# Patient Record
Sex: Female | Born: 1935 | Race: White | Hispanic: No | Smoking: Never smoker
Health system: Southern US, Community
[De-identification: ages and names within clinical notes are randomized; demographics above are authoritative.]

## PROBLEM LIST (undated history)

## (undated) DIAGNOSIS — I951 Orthostatic hypotension: Secondary | ICD-10-CM

## (undated) DIAGNOSIS — E039 Hypothyroidism, unspecified: Secondary | ICD-10-CM

## (undated) DIAGNOSIS — K7460 Unspecified cirrhosis of liver: Secondary | ICD-10-CM

## (undated) DIAGNOSIS — N390 Urinary tract infection, site not specified: Secondary | ICD-10-CM

## (undated) DIAGNOSIS — I10 Essential (primary) hypertension: Secondary | ICD-10-CM

## (undated) DIAGNOSIS — D649 Anemia, unspecified: Secondary | ICD-10-CM

## (undated) DIAGNOSIS — I471 Supraventricular tachycardia: Secondary | ICD-10-CM

## (undated) DIAGNOSIS — A310 Pulmonary mycobacterial infection: Secondary | ICD-10-CM

## (undated) DIAGNOSIS — M1990 Unspecified osteoarthritis, unspecified site: Secondary | ICD-10-CM

## (undated) DIAGNOSIS — I447 Left bundle-branch block, unspecified: Secondary | ICD-10-CM

## (undated) DIAGNOSIS — I4719 Other supraventricular tachycardia: Secondary | ICD-10-CM

## (undated) DIAGNOSIS — R42 Dizziness and giddiness: Secondary | ICD-10-CM

## (undated) DIAGNOSIS — G43909 Migraine, unspecified, not intractable, without status migrainosus: Secondary | ICD-10-CM

## (undated) DIAGNOSIS — Z853 Personal history of malignant neoplasm of breast: Secondary | ICD-10-CM

## (undated) DIAGNOSIS — F419 Anxiety disorder, unspecified: Secondary | ICD-10-CM

## (undated) DIAGNOSIS — Z8619 Personal history of other infectious and parasitic diseases: Secondary | ICD-10-CM

## (undated) DIAGNOSIS — E871 Hypo-osmolality and hyponatremia: Secondary | ICD-10-CM

## (undated) HISTORY — DX: Orthostatic hypotension: I95.1

## (undated) HISTORY — DX: Essential (primary) hypertension: I10

## (undated) HISTORY — PX: OTHER SURGICAL HISTORY: SHX169

## (undated) HISTORY — DX: Personal history of other infectious and parasitic diseases: Z86.19

## (undated) HISTORY — DX: Unspecified cirrhosis of liver: K74.60

## (undated) HISTORY — DX: Personal history of malignant neoplasm of breast: Z85.3

## (undated) HISTORY — DX: Urinary tract infection, site not specified: N39.0

## (undated) HISTORY — DX: Hypothyroidism, unspecified: E03.9

## (undated) HISTORY — PX: BUNIONECTOMY: SHX129

## (undated) HISTORY — PX: HEMORRHOID SURGERY: SHX153

## (undated) HISTORY — PX: CHOLECYSTECTOMY: SHX55

## (undated) HISTORY — PX: TUBAL LIGATION: SHX77

## (undated) HISTORY — DX: Pulmonary mycobacterial infection: A31.0

## (undated) HISTORY — DX: Hypo-osmolality and hyponatremia: E87.1

## (undated) HISTORY — PX: TONSILLECTOMY: SUR1361

## (undated) HISTORY — DX: Anxiety disorder, unspecified: F41.9

## (undated) HISTORY — DX: Anemia, unspecified: D64.9

## (undated) HISTORY — DX: Supraventricular tachycardia: I47.1

## (undated) HISTORY — DX: Dizziness and giddiness: R42

## (undated) HISTORY — DX: Left bundle-branch block, unspecified: I44.7

## (undated) HISTORY — DX: Other supraventricular tachycardia: I47.19

## (undated) HISTORY — PX: BREAST LUMPECTOMY: SHX2

## (undated) HISTORY — DX: Unspecified osteoarthritis, unspecified site: M19.90

## (undated) HISTORY — PX: CARPAL TUNNEL RELEASE: SHX101

## (undated) HISTORY — DX: Migraine, unspecified, not intractable, without status migrainosus: G43.909

---

## 2007-12-14 ENCOUNTER — Encounter: Payer: Self-pay | Admitting: Internal Medicine

## 2008-04-21 ENCOUNTER — Ambulatory Visit: Payer: Self-pay | Admitting: Cardiology

## 2008-04-21 ENCOUNTER — Ambulatory Visit: Payer: Self-pay | Admitting: Internal Medicine

## 2008-04-21 ENCOUNTER — Inpatient Hospital Stay (HOSPITAL_COMMUNITY): Admission: EM | Admit: 2008-04-21 | Discharge: 2008-04-25 | Payer: Self-pay | Admitting: Emergency Medicine

## 2008-04-22 ENCOUNTER — Ambulatory Visit: Payer: Self-pay | Admitting: Surgery

## 2008-04-22 ENCOUNTER — Encounter: Payer: Self-pay | Admitting: Internal Medicine

## 2008-04-22 ENCOUNTER — Encounter (INDEPENDENT_AMBULATORY_CARE_PROVIDER_SITE_OTHER): Payer: Self-pay | Admitting: Internal Medicine

## 2008-04-24 ENCOUNTER — Ambulatory Visit: Payer: Self-pay | Admitting: Infectious Diseases

## 2008-04-25 ENCOUNTER — Encounter: Payer: Self-pay | Admitting: Internal Medicine

## 2008-05-02 ENCOUNTER — Ambulatory Visit: Payer: Self-pay | Admitting: Internal Medicine

## 2008-05-20 ENCOUNTER — Encounter: Payer: Self-pay | Admitting: Internal Medicine

## 2008-06-09 ENCOUNTER — Ambulatory Visit: Payer: Self-pay | Admitting: Internal Medicine

## 2008-06-28 ENCOUNTER — Ambulatory Visit: Payer: Self-pay | Admitting: Internal Medicine

## 2008-06-28 DIAGNOSIS — N3090 Cystitis, unspecified without hematuria: Secondary | ICD-10-CM | POA: Insufficient documentation

## 2008-06-28 DIAGNOSIS — A310 Pulmonary mycobacterial infection: Secondary | ICD-10-CM

## 2008-07-01 DIAGNOSIS — I498 Other specified cardiac arrhythmias: Secondary | ICD-10-CM

## 2008-07-01 DIAGNOSIS — Z853 Personal history of malignant neoplasm of breast: Secondary | ICD-10-CM

## 2008-07-01 DIAGNOSIS — D649 Anemia, unspecified: Secondary | ICD-10-CM

## 2008-07-01 DIAGNOSIS — J984 Other disorders of lung: Secondary | ICD-10-CM | POA: Insufficient documentation

## 2008-07-01 DIAGNOSIS — Z8669 Personal history of other diseases of the nervous system and sense organs: Secondary | ICD-10-CM | POA: Insufficient documentation

## 2008-07-01 DIAGNOSIS — M810 Age-related osteoporosis without current pathological fracture: Secondary | ICD-10-CM | POA: Insufficient documentation

## 2008-07-01 DIAGNOSIS — I10 Essential (primary) hypertension: Secondary | ICD-10-CM | POA: Insufficient documentation

## 2008-07-01 DIAGNOSIS — E039 Hypothyroidism, unspecified: Secondary | ICD-10-CM | POA: Insufficient documentation

## 2008-07-01 DIAGNOSIS — K279 Peptic ulcer, site unspecified, unspecified as acute or chronic, without hemorrhage or perforation: Secondary | ICD-10-CM | POA: Insufficient documentation

## 2008-07-01 DIAGNOSIS — J309 Allergic rhinitis, unspecified: Secondary | ICD-10-CM | POA: Insufficient documentation

## 2008-07-01 DIAGNOSIS — Z9189 Other specified personal risk factors, not elsewhere classified: Secondary | ICD-10-CM | POA: Insufficient documentation

## 2008-07-06 ENCOUNTER — Encounter: Payer: Self-pay | Admitting: Internal Medicine

## 2008-07-13 ENCOUNTER — Encounter: Payer: Self-pay | Admitting: Internal Medicine

## 2008-07-22 ENCOUNTER — Ambulatory Visit: Payer: Self-pay | Admitting: Oncology

## 2008-08-17 ENCOUNTER — Ambulatory Visit: Payer: Self-pay | Admitting: Internal Medicine

## 2008-08-17 LAB — CONVERTED CEMR LAB
AST: 59 units/L — ABNORMAL HIGH (ref 0–37)
Albumin: 3.6 g/dL (ref 3.5–5.2)
Calcium: 9.4 mg/dL (ref 8.4–10.5)
Glucose, Bld: 66 mg/dL — ABNORMAL LOW (ref 70–99)
Potassium: 4.5 meq/L (ref 3.5–5.3)
Total Bilirubin: 0.9 mg/dL (ref 0.3–1.2)

## 2008-08-18 ENCOUNTER — Telehealth: Payer: Self-pay | Admitting: Internal Medicine

## 2008-08-24 ENCOUNTER — Ambulatory Visit (HOSPITAL_COMMUNITY): Admission: RE | Admit: 2008-08-24 | Discharge: 2008-08-24 | Payer: Self-pay | Admitting: Internal Medicine

## 2008-09-16 ENCOUNTER — Ambulatory Visit: Payer: Self-pay | Admitting: Oncology

## 2008-09-26 ENCOUNTER — Ambulatory Visit: Payer: Self-pay | Admitting: Internal Medicine

## 2008-09-26 DIAGNOSIS — K759 Inflammatory liver disease, unspecified: Secondary | ICD-10-CM | POA: Insufficient documentation

## 2008-10-11 ENCOUNTER — Encounter: Payer: Self-pay | Admitting: Internal Medicine

## 2008-10-18 LAB — CBC WITH DIFFERENTIAL/PLATELET
Basophils Absolute: 0 10*3/uL (ref 0.0–0.1)
Eosinophils Absolute: 0.1 10*3/uL (ref 0.0–0.5)
HCT: 33 % — ABNORMAL LOW (ref 34.8–46.6)
LYMPH%: 27.5 % (ref 14.0–49.7)
MONO#: 0.4 10*3/uL (ref 0.1–0.9)
NEUT#: 2 10*3/uL (ref 1.5–6.5)
NEUT%: 59.2 % (ref 38.4–76.8)
Platelets: 149 10*3/uL (ref 145–400)
WBC: 3.5 10*3/uL — ABNORMAL LOW (ref 3.9–10.3)

## 2008-10-18 LAB — COMPREHENSIVE METABOLIC PANEL
CO2: 29 mEq/L (ref 19–32)
Creatinine, Ser: 0.7 mg/dL (ref 0.40–1.20)
Glucose, Bld: 68 mg/dL — ABNORMAL LOW (ref 70–99)
Total Bilirubin: 1.2 mg/dL (ref 0.3–1.2)

## 2008-10-18 LAB — CANCER ANTIGEN 27.29: CA 27.29: 25 U/mL (ref 0–39)

## 2008-11-03 ENCOUNTER — Encounter: Payer: Self-pay | Admitting: Internal Medicine

## 2008-11-25 DIAGNOSIS — F411 Generalized anxiety disorder: Secondary | ICD-10-CM | POA: Insufficient documentation

## 2008-11-25 DIAGNOSIS — G43909 Migraine, unspecified, not intractable, without status migrainosus: Secondary | ICD-10-CM | POA: Insufficient documentation

## 2008-11-25 DIAGNOSIS — N390 Urinary tract infection, site not specified: Secondary | ICD-10-CM

## 2008-11-25 DIAGNOSIS — E871 Hypo-osmolality and hyponatremia: Secondary | ICD-10-CM | POA: Insufficient documentation

## 2008-11-25 DIAGNOSIS — C50919 Malignant neoplasm of unspecified site of unspecified female breast: Secondary | ICD-10-CM | POA: Insufficient documentation

## 2008-11-25 DIAGNOSIS — A318 Other mycobacterial infections: Secondary | ICD-10-CM

## 2008-11-25 DIAGNOSIS — R42 Dizziness and giddiness: Secondary | ICD-10-CM

## 2008-11-25 DIAGNOSIS — R000 Tachycardia, unspecified: Secondary | ICD-10-CM

## 2008-12-08 ENCOUNTER — Ambulatory Visit: Payer: Self-pay | Admitting: Internal Medicine

## 2008-12-08 DIAGNOSIS — I447 Left bundle-branch block, unspecified: Secondary | ICD-10-CM

## 2009-05-08 ENCOUNTER — Encounter: Payer: Self-pay | Admitting: Internal Medicine

## 2009-05-15 ENCOUNTER — Encounter: Payer: Self-pay | Admitting: Internal Medicine

## 2009-06-05 ENCOUNTER — Ambulatory Visit: Payer: Self-pay | Admitting: Internal Medicine

## 2009-10-17 ENCOUNTER — Ambulatory Visit: Payer: Self-pay | Admitting: Oncology

## 2009-10-18 LAB — CBC WITH DIFFERENTIAL/PLATELET
EOS%: 2.1 % (ref 0.0–7.0)
Eosinophils Absolute: 0.1 10*3/uL (ref 0.0–0.5)
HCT: 33.1 % — ABNORMAL LOW (ref 34.8–46.6)
HGB: 11.4 g/dL — ABNORMAL LOW (ref 11.6–15.9)
MCH: 31.5 pg (ref 25.1–34.0)
MONO%: 8.4 % (ref 0.0–14.0)
RBC: 3.62 10*6/uL — ABNORMAL LOW (ref 3.70–5.45)
lymph#: 0.9 10*3/uL (ref 0.9–3.3)

## 2009-10-18 LAB — COMPREHENSIVE METABOLIC PANEL
ALT: 42 U/L — ABNORMAL HIGH (ref 0–35)
AST: 58 U/L — ABNORMAL HIGH (ref 0–37)
BUN: 11 mg/dL (ref 6–23)
CO2: 24 mEq/L (ref 19–32)
Calcium: 8.6 mg/dL (ref 8.4–10.5)
Total Bilirubin: 1.2 mg/dL (ref 0.3–1.2)
Total Protein: 8.3 g/dL (ref 6.0–8.3)

## 2009-10-25 ENCOUNTER — Encounter: Payer: Self-pay | Admitting: Internal Medicine

## 2009-12-14 ENCOUNTER — Encounter: Payer: Self-pay | Admitting: Internal Medicine

## 2009-12-14 ENCOUNTER — Ambulatory Visit: Payer: Self-pay

## 2010-01-29 IMAGING — CR DG CHEST 1V PORT
1 series · 1 of 1 positions shown · non-contrast
Comparison: Portable exam 2698 hours without priors for comparison.

CLINICAL DATA: Possible STEMI, hypertension

PORTABLE CHEST - 1 VIEW

[view not recorded]
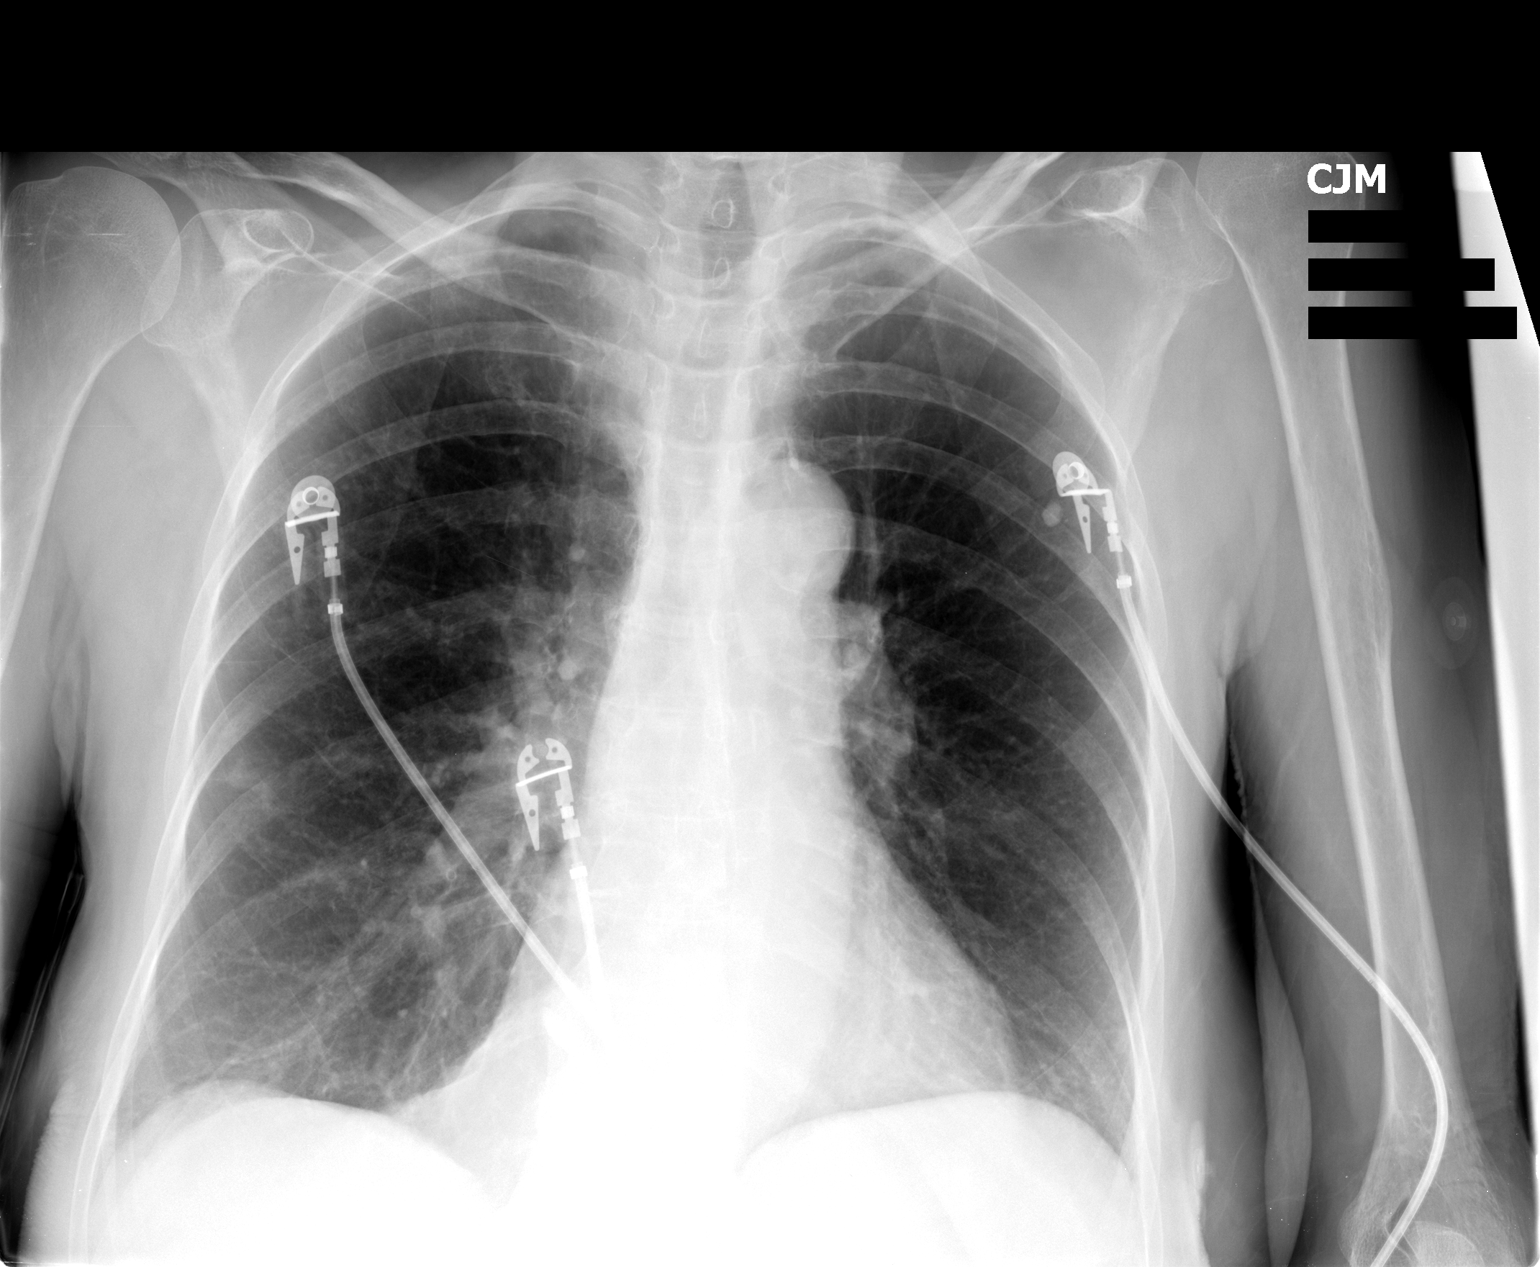

[1 of 1 positions shown; findings below may reference images not displayed]

FINDINGS: Normal heart size and pulmonary vascularity.
Partially calcified tortuous aorta.
Calcified granuloma left upper lobe with potentially small
calcified lymph node at left hilum.
Bronchitic changes with minimal bibasilar atelectasis.
Questionable nodular density in right mid lung at minor fissure
level, 7 mm diameter.
No pulmonary infiltrate or pleural effusion.
Bony demineralization.
IMPRESSION: Bronchitic changes with questionable 7 mm diameter right mid lung
nodule.
Calcified granuloma left lung.
Follow-up upright PA and lateral chest radiographs recommended when
the patient's condition permits in order to better assess.

## 2010-06-24 ENCOUNTER — Encounter: Payer: Self-pay | Admitting: Internal Medicine

## 2010-07-03 NOTE — Assessment & Plan Note (Signed)
Summary: 6 month rov/sl  Medications Added DIOVAN 80 MG TABS (VALSARTAN) Take 1/2 tablet by mouth once a day ALENDRONATE SODIUM 70 MG TABS (ALENDRONATE SODIUM) Take 1 tablet by mouth once a week VITAMIN C 500 MG  TABS (ASCORBIC ACID) 1/2 tab once daily        Visit Type:  Follow-up Primary Provider:  Raechel Chute MD   History of Present Illness: The patient presents today for routine electrophysiology followup. She reports doing very well since last being seen in our clinic.  She has had no presyncope or syncope. The patient denies symptoms of palpitations, chest pain, shortness of breath, orthopnea, PND, lower extremity edema, dizziness, presyncope, syncope, or neurologic sequela. The patient is tolerating medications without difficulties and is otherwise without complaint today.  She has an ongoing workup for cirrhosis. Her hyponatremia is stable.  Current Medications (verified): 1)  Diovan 80 Mg Tabs (Valsartan) .... Take 1/2 Tablet By Mouth Once A Day 2)  Levothroid 50 Mcg Tabs (Levothyroxine Sodium) .... Take 1 Tablet By Mouth Once A Day 3)  Allegra 180 Mg Tabs (Fexofenadine Hcl) .... Take 1 Tablet By Mouth Once A Day 4)  Alendronate Sodium 70 Mg Tabs (Alendronate Sodium) .... Take 1 Tablet By Mouth Once A Week 5)  Calcium 600/vitamin D 600-400 Mg-Unit Tabs (Calcium Carbonate-Vitamin D) .... Take 2 Tablets By Mouth Once A Day 6)  Aspirin Adult Low Strength 81 Mg Tbec (Aspirin) .... Take 1 Tablet By Mouth Once A Day 7)  Zolpidem Tartrate 10 Mg Tabs (Zolpidem Tartrate) .... Take 1/2 - 1 Tablet By Mouth At Bedtime As Needed 8)  Vitamin C 500 Mg  Tabs (Ascorbic Acid) .... 1/2 Tab Once Daily  Allergies: 1)  ! Sulfa 2)  ! Pcn  Past History:  Past Medical History:  1. Orthostatic presyncope.   2. Prior atrioventricular nodal reentrant tachycardia status post slow       atrioventricular nodal pathway modification in [place].   3. Hypothyroidism.   4. Mycobacterium  avium-intracellulare infection.   5. History of breast cancer with pulmonary nodules.   6. Hypertension.   7. Migraine headaches.   8. Vertigo.   9. Anemia.   10.Hyponatremia.   11.History of hepatitis.   12.Recurrent urinary tract infections.   13.Degenerative joint disease.   14.Anxiety.  15. LBBB  16. Cirrhosis of unclear etiology  Past Surgical History: Reviewed history from 12/08/2008 and no changes required. Cholecystectomy Carpal tunnel release Hemorrhoidectomy Hysterectomy Lumpectomy Tubal ligation Tonsillectomy Bunionectomy Slow pathway modification for AVNRT in [place]  Social History: Reviewed history from 11/25/2008 and no changes required.  No history of tobacco, alcohol, or illicit drug use.   She is a retired Associate Professor.   Review of Systems       All systems are reviewed and negative except as listed in the HPI.   Vital Signs:  Patient profile:   77 year old female Height:      62.5 inches Weight:      111 pounds BMI:     20.05 Pulse rate:   76 / minute BP sitting:   130 / 80  (left arm)  Vitals Entered By: Laurance Flatten CMA (June 05, 2009 11:20 AM)  Physical Exam  General:  Well developed, well nourished, in no acute distress. Head:  normocephalic and atraumatic Eyes:  PERRLA/EOM intact; conjunctiva and lids normal. Nose:  no deformity, discharge, inflammation, or lesions Mouth:  Teeth, gums and palate normal. Oral mucosa normal. Neck:  Neck supple,  no JVD. No masses, thyromegaly or abnormal cervical nodes. Lungs:  Clear bilaterally to auscultation and percussion. Heart:  Non-displaced PMI, chest non-tender; regular rate and rhythm, S1, S2 without murmurs, rubs or gallops. Carotid upstroke normal, no bruit. Normal abdominal aortic size, no bruits. Femorals normal pulses, no bruits. Pedals normal pulses. No edema, no varicosities. Abdomen:  Bowel sounds positive; abdomen soft and non-tender without masses, organomegaly, or hernias noted. No  hepatosplenomegaly. Msk:  Back normal, normal gait. Muscle strength and tone normal. Pulses:  pulses normal in all 4 extremities Extremities:  No clubbing or cyanosis. Neurologic:  Alert and oriented x 3. Skin:  Intact without lesions or rashes. Cervical Nodes:  no significant adenopathy Psych:  Normal affect.   EKG  Procedure date:  06/05/2009  Findings:      sinus rhythm 73 bpm, LBBB  Impression & Recommendations:  Problem # 1:  ORTHOSTATIC DIZZINESS (ICD-780.4) stable without presyncope.  Continue salt liberalization.  Problem # 2:  AV NODAL REENTRY TACHYCARDIA (ICD-427.89) No recent episodes of SVT.  Problem # 3:  HYPERTENSION (ICD-401.9) stable, no changes today  Her updated medication list for this problem includes:    Diovan 80 Mg Tabs (Valsartan) .Marland Kitchen... Take 1/2 tablet by mouth once a day    Aspirin Adult Low Strength 81 Mg Tbec (Aspirin) .Marland Kitchen... Take 1 tablet by mouth once a day  Problem # 4:  LBBB (ICD-426.3) stable  Her updated medication list for this problem includes:    Aspirin Adult Low Strength 81 Mg Tbec (Aspirin) .Marland Kitchen... Take 1 tablet by mouth once a day  Patient Instructions: 1)  return in 6 months 2)  contact our office should problems arise in the interim.

## 2010-07-03 NOTE — Progress Notes (Signed)
[facility]: [facility]   Imported By: Debby Freiberg 11/25/2009 09:54:48  _____________________________________________________________________  External Attachment:    Type:   Image     Comment:   External Document

## 2010-07-03 NOTE — Consult Note (Signed)
[facility]   Imported By: Florinda Marker 11/22/2009 15:21:56  _____________________________________________________________________  External Attachment:    Type:   Image     Comment:   External Document

## 2010-07-03 NOTE — Assessment & Plan Note (Signed)
Summary: f61m  Medications Added MELATONIN 3 MG TABS (MELATONIN) as needed        Visit Type:  Follow-up Primary Provider:  Raechel Chute MD   History of Present Illness: The patient presents today for routine electrophysiology followup. She reports doing very well since last being seen in our clinic.  She has had no presyncope or syncope. The patient denies symptoms of palpitations, chest pain, shortness of breath, orthopnea, PND, lower extremity edema, dizziness, presyncope, syncope, or neurologic sequela. The patient is tolerating medications without difficulties and is otherwise without complaint today.  Her hyponatremia is stable.  Current Medications (verified): 1)  Diovan 80 Mg Tabs (Valsartan) .... Take 1/2 Tablet By Mouth Once A Day 2)  Levothroid 50 Mcg Tabs (Levothyroxine Sodium) .... Take 1 Tablet By Mouth Once A Day 3)  Allegra 180 Mg Tabs (Fexofenadine Hcl) .... Take 1 Tablet By Mouth Once A Day 4)  Alendronate Sodium 70 Mg Tabs (Alendronate Sodium) .... Take 1 Tablet By Mouth Once A Week 5)  Calcium 600/vitamin D 600-400 Mg-Unit Tabs (Calcium Carbonate-Vitamin D) .... Take 2 Tablets By Mouth Once A Day 6)  Zolpidem Tartrate 10 Mg Tabs (Zolpidem Tartrate) .... Take 1/2 - 1 Tablet By Mouth At Bedtime As Needed 7)  Vitamin C 500 Mg  Tabs (Ascorbic Acid) .... 1/2 Tab Once Daily 8)  Melatonin 3 Mg Tabs (Melatonin) .... As Needed  Allergies: 1)  ! Sulfa 2)  ! Pcn  Past History:  Past Medical History: Reviewed history from 06/05/2009 and no changes required.  1. Orthostatic presyncope.   2. Prior atrioventricular nodal reentrant tachycardia status post slow       atrioventricular nodal pathway modification in [place].   3. Hypothyroidism.   4. Mycobacterium avium-intracellulare infection.   5. History of breast cancer with pulmonary nodules.   6. Hypertension.   7. Migraine headaches.   8. Vertigo.   9. Anemia.   10.Hyponatremia.   11.History of hepatitis.   12.Recurrent urinary tract infections.   13.Degenerative joint disease.   14.Anxiety.  15. LBBB  16. Cirrhosis of unclear etiology  Past Surgical History: Reviewed history from 12/08/2008 and no changes required. Cholecystectomy Carpal tunnel release Hemorrhoidectomy Hysterectomy Lumpectomy Tubal ligation Tonsillectomy Bunionectomy Slow pathway modification for AVNRT in [place]  Social History: Reviewed history from 11/25/2008 and no changes required.  No history of tobacco, alcohol, or illicit drug use.   She is a retired Associate Professor.   Review of Systems       All systems are reviewed and negative except as listed in the HPI.   Vital Signs:  Patient profile:   77 year old female Height:      62.5 inches Weight:      113 pounds BMI:     20.41 Pulse rate:   65 / minute BP sitting:   122 / 74  (left arm)  Vitals Entered By: Laurance Flatten CMA (December 14, 2009 10:58 AM)  Physical Exam  General:  Well developed, well nourished, in no acute distress. Head:  normocephalic and atraumatic Eyes:  PERRLA/EOM intact; conjunctiva and lids normal. Mouth:  Teeth, gums and palate normal. Oral mucosa normal. Neck:  Neck supple, no JVD. No masses, thyromegaly or abnormal cervical nodes. Lungs:  Clear bilaterally to auscultation and percussion. Heart:  Non-displaced PMI, chest non-tender; regular rate and rhythm, S1, S2 without murmurs, rubs or gallops. Carotid upstroke normal, no bruit. Normal abdominal aortic size, no bruits. Femorals normal pulses, no bruits. Pedals normal pulses.  No edema, no varicosities. Abdomen:  Bowel sounds positive; abdomen soft and non-tender without masses, organomegaly, or hernias noted. No hepatosplenomegaly. Msk:  Back normal, normal gait. Muscle strength and tone normal. Pulses:  pulses normal in all 4 extremities Extremities:  No clubbing or cyanosis. Neurologic:  Alert and oriented x 3.   EKG  Procedure date:  12/14/2009  Findings:      sinus  rhythm 65 bpm,r PR 216, incomplete LBBB (QRS 126)  Impression & Recommendations:  Problem # 1:  ORTHOSTATIC DIZZINESS (ICD-780.4) Improved adequate hydration and avoidance of heat was encouraged  Problem # 2:  HYPERTENSION (ICD-401.9)  at goal  The following medications were removed from the medication list:    Aspirin Adult Low Strength 81 Mg Tbec (Aspirin) .Marland Kitchen... Take 1 tablet by mouth once a day Her updated medication list for this problem includes:    Diovan 80 Mg Tabs (Valsartan) .Marland Kitchen... Take 1/2 tablet by mouth once a day  Problem # 3:  LBBB (ICD-426.3) asymptomatic  improved  Problem # 4:  UTI (ICD-599.0) stable  Problem # 5:  TACHYCARDIA (ICD-785.0) no further episodes  Patient Instructions: 1)  Your physician recommends that you schedule a follow-up appointment in: 9 months with Dr Johney Frame Prescriptions: DIOVAN 80 MG TABS (VALSARTAN) Take 1/2 tablet by mouth once a day  #30 x 6   Entered by:   Dennis Bast, RN, BSN   Authorized by:   Hillis Range, MD   Signed by:   Dennis Bast, RN, BSN on 12/14/2009   Method used:   Electronically to        [facility] [phone]* (retail)       [address]       [place], [place]  [address]       Ph: [phone]       Fax: [phone]   RxID:   [id number] DIOVAN 80 MG TABS (VALSARTAN) Take 1/2 tablet by mouth once a day  #30 x 6   Entered by:   Dennis Bast, RN, BSN   Authorized by:   Hillis Range, MD   Signed by:   Dennis Bast, RN, BSN on 12/14/2009   Method used:   Print then Give to Patient   RxID:   [id number]

## 2010-10-10 ENCOUNTER — Encounter: Payer: Self-pay | Admitting: Internal Medicine

## 2010-10-11 ENCOUNTER — Encounter: Payer: Self-pay | Admitting: Internal Medicine

## 2010-10-11 ENCOUNTER — Ambulatory Visit (INDEPENDENT_AMBULATORY_CARE_PROVIDER_SITE_OTHER): Payer: Medicare PPO | Admitting: Internal Medicine

## 2010-10-11 DIAGNOSIS — I447 Left bundle-branch block, unspecified: Secondary | ICD-10-CM

## 2010-10-11 MED ORDER — VALSARTAN 80 MG PO TABS: ORAL_TABLET | ORAL | Status: DC

## 2010-10-11 NOTE — Assessment & Plan Note (Signed)
Stable No change required today  

## 2010-10-11 NOTE — Assessment & Plan Note (Signed)
Resolved No changes Adequate hydration encouraged

## 2010-10-11 NOTE — Patient Instructions (Signed)
Your physician wants you to follow-up in: 12 months with Dr Allred You will receive a reminder letter in the mail two months in advance. If you don't receive a letter, please call our office to schedule the follow-up appointment.  

## 2010-10-11 NOTE — Progress Notes (Signed)
The patient presents today for routine electrophysiology followup.  Since last being seen in our clinic, the patient reports doing very well.  Today, she denies symptoms of palpitations, chest pain, shortness of breath, orthopnea, PND, lower extremity edema, dizziness, presyncope, syncope, or neurologic sequela.  The patient feels that she is tolerating medications without difficulties and is otherwise without complaint today.   Past Medical History  Diagnosis Date  . Orthostatic syncope   . Atrioventricular nodal re-entry tachycardia     s/p ablation in [place]  . Hypothyroidism   . Mycobacterium avium-intracellulare infection   . HX: breast cancer   . HTN (hypertension)   . Migraine headache   . Vertigo   . Anemia   . Hyponatremia   . History of hepatitis   . Recurrent UTI   . DJD (degenerative joint disease)   . Anxiety   . LBBB (left bundle branch block)   . Cirrhosis    Past Surgical History  Procedure Date  . Cholecystectomy   . Carpal tunnel release   . Hemorrhoid surgery   . Breast lumpectomy   . Tubal ligation   . Tonsillectomy   . Bunionectomy   . Slow pathway modification     AVNRT     Current Outpatient Prescriptions  Medication Sig Dispense Refill  . alendronate (FOSAMAX) 70 MG tablet Take 70 mg by mouth every 7 (seven) days. Take with a full glass of water on an empty stomach.       . Ascorbic Acid (VITAMIN C) 500 MG tablet Take 250 mg by mouth daily.        . Calcium Carbonate-Vitamin D (CALCIUM 600/VITAMIN D) 600-400 MG-UNIT per tablet Take 1 tablet by mouth daily.        . Cholecalciferol (VITAMIN D) 1000 UNITS capsule Take 1,000 Units by mouth daily.        . fexofenadine (ALLEGRA) 180 MG tablet Take 180 mg by mouth daily.        Marland Kitchen levothyroxine (SYNTHROID, LEVOTHROID) 50 MCG tablet Take 50 mcg by mouth daily.        . Melatonin 3 MG CAPS Take by mouth as needed.        . Potassium 99 MG TABS 1 tab po qd       . valsartan (DIOVAN) 80 MG tablet 1/2 tab po  qd      . zolpidem (AMBIEN) 10 MG tablet Take 10 mg by mouth at bedtime as needed.          Allergies  Allergen Reactions  . Penicillins   . Sulfonamide Derivatives     History   Social History  . Marital Status: Widowed    Spouse Name: N/A    Number of Children: N/A  . Years of Education: N/A   Occupational History  . retired Set designer    Social History Main Topics  . Smoking status: Never Smoker   . Smokeless tobacco: Not on file  . Alcohol Use: No  . Drug Use: No  . Sexually Active: Not on file   Other Topics Concern  . Not on file   Social History Narrative  . No narrative on file    Family History  Problem Relation Age of Onset  . Coronary artery disease    . Stroke    . Asthma      ekg today reveals sinus rhythm 65 bpm, PR 208, LBBB

## 2010-10-16 ENCOUNTER — Other Ambulatory Visit: Payer: Self-pay | Admitting: Oncology

## 2010-10-16 ENCOUNTER — Encounter (HOSPITAL_BASED_OUTPATIENT_CLINIC_OR_DEPARTMENT_OTHER): Payer: Medicare PPO | Admitting: Oncology

## 2010-10-16 DIAGNOSIS — C3490 Malignant neoplasm of unspecified part of unspecified bronchus or lung: Secondary | ICD-10-CM

## 2010-10-16 DIAGNOSIS — C50919 Malignant neoplasm of unspecified site of unspecified female breast: Secondary | ICD-10-CM

## 2010-10-16 DIAGNOSIS — J984 Other disorders of lung: Secondary | ICD-10-CM

## 2010-10-16 LAB — COMPREHENSIVE METABOLIC PANEL
AST: 67 U/L — ABNORMAL HIGH (ref 0–37)
Albumin: 3.5 g/dL (ref 3.5–5.2)
Alkaline Phosphatase: 168 U/L — ABNORMAL HIGH (ref 39–117)
CO2: 21 mEq/L (ref 19–32)
Chloride: 97 mEq/L (ref 96–112)
Potassium: 4.5 mEq/L (ref 3.5–5.3)
Sodium: 131 mEq/L — ABNORMAL LOW (ref 135–145)
Total Bilirubin: 1 mg/dL (ref 0.3–1.2)

## 2010-10-16 LAB — CBC WITH DIFFERENTIAL/PLATELET
EOS%: 1.9 % (ref 0.0–7.0)
HCT: 31.7 % — ABNORMAL LOW (ref 34.8–46.6)
MCH: 30.6 pg (ref 25.1–34.0)
MCV: 89.7 fL (ref 79.5–101.0)
MONO#: 0.4 10*3/uL (ref 0.1–0.9)
NEUT#: 2.4 10*3/uL (ref 1.5–6.5)
Platelets: 135 10*3/uL — ABNORMAL LOW (ref 145–400)
RDW: 15.8 % — ABNORMAL HIGH (ref 11.2–14.5)
WBC: 3.9 10*3/uL (ref 3.9–10.3)
lymph#: 1 10*3/uL (ref 0.9–3.3)

## 2010-10-16 NOTE — Consult Note (Signed)
NAMESHELI, DORIN NO.:  [id number]   MEDICAL RECORD NO.:  [id number]          PATIENT TYPE:  INP   LOCATION:  3703                         FACILITY:  [facility]   PHYSICIAN:  Wendi Snipes, MD DATE OF BIRTH:  21-Nov-1926   DATE OF CONSULTATION:  DATE OF DISCHARGE:                                 CONSULTATION   REQUESTING PHYSICIAN:  Dr. Orvan Falconer.   PRIMARY CARDIOLOGIST:  Dr. Dennison Nancy, [place].   CHIEF COMPLAINT:  Blacking out spells.   HISTORY OF PRESENT ILLNESS:  This is a 77 year old white female with a  history of SVT status post ablation December 2008, hypertension here  with a brief episode of presyncope.  She states she was at her home in  her usual state of health when she noticed her vision go blank for a few  moments.  This was associated with a slight dizziness; however, she  reports no palpitations, chest pain, shortness of breath during this  time.  She was standing and the symptoms went away spontaneously.  She  states that she has had a recent episode for a brief moment and she did  not think anything out about it that time and it was last week.  This  has happened before and she was seen December 2008 after an episode of  spontaneous syncope when she was in her kitchen in [place].  She was  seen by electrophysiology.  A tilt table was performed which was  negative.  She states that there was a provocative test performed that  is unknown and she subsequent required an ablation.  She is unable to  provide more details.   PAST MEDICAL HISTORY:  1. Unknown SVT status post ablation December 2008 at [facility] in [place], [place], by Dr. Ahmed Prima.  2. Hypertension.  3. Status post breast cancer.   ALLERGIES:  1. PENICILLIN.  2. SULFA.   MEDICATIONS ON ADMISSION:  Allegra, zolpidem, lisinopril, alendronate,  vitamin C, calcium, aspirin.   She lives in [place] to be close to a daughter.  She is retired.  She  was previously a Associate Professor.  She has never smoked.   FAMILY HISTORY:  Her mother had early coronary disease associated with  heart failure.  Her father had strokes.   REVIEW OF SYSTEMS:  All 14 systems were reviewed and were negative  except those mentioned in detail in the HPI.  Marland Kitchen   PHYSICAL EXAM:  Blood pressure is 127/63.  Pulse is 57 and regular.  Satting 98% on room air.  Generally she is a 77 year old female appearing stated age, no acute  distress.  HEENT:  Moist mucous membranes.  Pupils equal, round, react to light  accommodation.  Anicteric sclera.  NECK:  No jugular venous distention.  No carotid bruits.  No  thyromegaly.  CARDIOVASCULAR:  Regular rate and rhythm.  No murmurs, rubs or gallops.  LUNGS:  Clear to auscultation bilaterally.  ABDOMEN:  Nontender, nondistended.  Positive bowel sounds.  No masses.  SKIN:  Warm, dry intact.  No rashes.  NEUROLOGIC:  Alert  and oriented x3.  Cranial nerves II-XII are grossly  intact.  No focal neurologic deficits.  EXTREMITIES:  No clubbing,  cyanosis or edema.  Two plus pulses throughout.  Left lower calf appears  to be slightly larger than the right; however there is no tenderness to  palpation.  No cords and no swelling.  PSYCH:  Mood and affect are appropriate.   RADIOLOGY:  Shows bronchitic changes and a 7 mm nodule in the right  midlung.  EKG shows normal sinus rhythm at 68 beats per minute with a  left bundle branch block.   LABS:  White count is 5.1, hematocrit is 35, platelets 133, potassium is  4.8.  Sodium is 126, creatinine 0.9.  Her cardiac enzymes are  unremarkable.   ASSESSMENT/PLAN:  This is a 77 year old white female with a recent  supraventricular tachycardia ablation in [place] who presents with  presyncope described as somewhat similar to her previous symptoms that  required electrophysiologic intervention.  Presyncope.  We will try to get old records from her previous  hospitalization.  Otherwise,  check a transthoracic echocardiogram for  any structural or valvular heart disease.  She had no bruits; however,  would check carotid ultrasound for the concern of amaurosis fugax in  this patient.  Also check a D-dimer.  Please continue to monitor on  telemetry for any signs of an arrhythmia.  It appears that this unknown  supraventricular tachycardia is likely the culprit.  There is no  objective evidence thus far.  Please continue to avoid atrioventricular  nodal blocking agents as this may have been a brady arrhythmia as well.  Will follow.      Wendi Snipes, MD  Electronically Signed     [facility]MED[facility]  D:  04/21/2008  T:  04/22/2008  Job:  [id number]

## 2010-10-16 NOTE — H&P (Signed)
Miranda, Blackburn NO.:  [id number]   MEDICAL RECORD NO.:  [id number]          PATIENT TYPE:  INP   LOCATION:  1825                         FACILITY:  [facility]   PHYSICIAN:  Vania Rea, M.D. DATE OF BIRTH:  06-Jan-1927   DATE OF ADMISSION:  04/21/2008  DATE OF DISCHARGE:                              HISTORY & PHYSICAL   PRIMARY CARE PHYSICIAN:  Raechel Chute, M.D., [facility].   CHIEF COMPLAINT:  Presyncope.   HISTORY OF PRESENT ILLNESS:  This is a 77 year old Caucasian lady who  recently relocated to the area from [place] and has a history of cardiac  conduction abnormality, status post electrical ablation in February  2009, and whose episodes of presyncope have significantly improved since  that time.  However, the patient does admit to having since the  ablation, minor episodes of blacking out without syncope and while in  her doctor's office today, stepping onto the scale, she had another such  episode where everything went black.  She felt a little dizzy and she  leaned up against a wall.  She did not lose consciousness.  She did not  have any palpitations and she said she feels the episode lasted for  about two minutes.  An EKG was done which showed sinus rhythm with a  left bundle branch block and because it was not known whether this was  new or old, the patient was sent to the emergency room for evaluation.   The patient gives a history of migraine headaches manifesting as vertigo  but says she has had no such headache for the past nearly two years, but  she does have history of presyncope as noted above.   The patient for the past week is being treated with Cipro for urinary  tract infection, Cipro prescribed in the Urgent Care on Monday.  However, today Urgent Care faxed sensitivities to her primary  physicians, which indicate that she has an E. coli infection resistant  to quinolones but sensitive to nitrofurantoin,  cephalosporins, and  Bactrim.  The patient continues to have urinary tract symptoms of  frequency, dysuria, and lower abdominal cramping with micturition.  The  patient denies any nausea, vomiting, diarrhea.  She does describe  progressive weight loss since her diagnosis of breast cancer in 2003.  Her Arimidex was discontinued after five years and she feels she is  cleared of recurrence.  She did have a lymph node biopsy in the left  axilla in September 2008 and it was shown to be negative.   PAST MEDICAL HISTORY:  1. Hypertension.  2. Hypothyroidism.  3. History of hepatitis.  She is unsure of what type.  4. Osteoporosis, status post multiple compression fractures.  5. Episodic allergies.  6. History of breast cancer.   PAST SURGICAL HISTORY:  1. Tonsillectomy.  2. Appendectomy.  3. Hemorrhoidectomy.  4. Carpal tunnel syndrome.  5. Bunionectomy.  6. Vertebroplasty in 2002.  7. Cholecystectomy, 2002.  8. Breast biopsy in 2003 with subsequent lumpectomy and radiation      therapy.  9. Right herniorrhaphy, 2003.  10.Electrophysiological ablation on July 19, 2007.   MEDICATIONS:  1. Allegra 180 mg daily p.r.n.  2. Ambien 5 mg at bedtime p.r.n.  3. Lisinopril 10 mg daily.  4. Levothyroxine 50 mcg daily.  5. Calcium with D 1200 mg at bedtime.  6. Aspirin 81 mg daily.  7. Vitamin C 250 mg daily.  8. Alendronate 70 mg each Sunday.   ALLERGIES:  SULFA causes vomiting and she cannot remember but she thinks  she has the same problem with PENICILLIN..   SOCIAL HISTORY:  No history of tobacco, alcohol, or illicit drug use.  She is a retired Associate Professor.   FAMILY HISTORY:  Significant for coronary artery disease and vertigo in  her mother.  Her father had multiple strokes.  She has a sister with  asthma and vertigo.  Her husband was deceased two years ago and since  then she has had no more migraines.   REVIEW OF SYSTEMS:  Other than noted above, a 10-point review of  systems  is significant only for the fact that she bruises very easily and her  left leg has been chronically a little larger than her right.  This is  not new.  She does have episodic lower extremity edema withstanding.  Other than this a 10-point review of systems was unremarkable.   PHYSICAL EXAMINATION:  GENERAL:  This is a very pleasant and chatty,  elderly, 77 year old Caucasian lady lying in bed in no distress at all.  VITAL SIGNS:  Her temperature is 97.3, her pulse is 60, respirations 22,  blood pressure 114/52.  She is saturating at 97% on room air.  HEENT:  Her pupils are round and equal.  Mucous membranes pink and  anicteric.  She is not dehydrated.  NECK:  No cervical lymphadenopathy or thyromegaly.  No jugular venous  distention.  CHEST:  Clear to auscultation bilaterally.  CARDIOVASCULAR:  Regular rhythm without murmur.  ABDOMEN:  Scaphoid, soft, and nontender.  There are no masses.  EXTREMITIES:  Without edema.  She has 2+ pulses bilaterally.  Her left  calf is somewhat larger than the right.  She has bilateral prominent  veins, but her veins are prominent throughout her whole body.  There is  no specific edema.  CENTRAL NERVOUS SYSTEMS:  Cranial nerves II-XII are grossly intact and  she has no focal neurologic deficit.   LABORATORY DATA:  Her white count is 5.1, hemoglobin 12.1, MCV 95.5,  platelets 133.  Her sodium is 126, potassium 4.8, chloride 95, CO2 23,  glucose 59, BUN 13, creatinine 0.95, calcium 8.7.  Her urinalysis is  significant only for moderate leukocyte esterase and 21-50 white cells.  She is carrying the results of urine culture done November 16.  Results  of November 18 as referenced above, showing greater than 100,000 E.  coli, resistant to penicillin, sensitive to cephalosporin, resistant to  quinolones, sensitive to nitrofurantoin, sensitive to Bactrim.  Her  cardiac enzymes are completely normal with Myoglobin 170 and troponins  undetectable.    Her EKG shows normal sinus rhythm with left bundle branch.  Chest x-ray  shows normal heart size and pulmonary vasculature, bronchitic changes,  questionable 7-mm right midline nodule, calcified granuloma of the left  lung.   ASSESSMENT:  1. Presyncopal episodes in an elderly lady with a past history of      electrical conduction defect, status post ablation in February of      this year.  2. History of breast cancer six years out.  3. Urinary tract infection.  4. Hypertension, controlled.  5. Abnormal chest x-ray.  6. Chronic swelling of the left calf.  7. History of hypothyroidism.  8. History of hepatitis of unknown character.   PLAN:  1. Will admit this lady for cycling of her cardiac enzymes and will      consult the cardiology service for assistance with management.  2. Will check her D-dimers and if positive, will go ahead and get CT      angiogram of the chest.  Will treat her urinary tract infection      with Rocephin.  Check her thyroid function studies.  Other plans as      per orders.      Vania Rea, M.D.  Electronically Signed     LC/MEDQ  D:  04/21/2008  T:  04/22/2008  Job:  [id number]   cc:   Kendrick Ranch, M.D.  Reather Littler, M.D.

## 2010-10-16 NOTE — Discharge Summary (Signed)
[name], Blackburn NO.:  [id number]   MEDICAL RECORD NO.:  [id number]          PATIENT TYPE:  INP   LOCATION:  3734                         FACILITY:  [facility]   PHYSICIAN:  Eduard Clos, MDDATE OF BIRTH:  Jul 07, 1926   DATE OF ADMISSION:  04/21/2008  DATE OF DISCHARGE:                               DISCHARGE SUMMARY   PRIORITY INTERIM DISCHARGE SUMMARY   FINAL DIAGNOSES:  1. Near syncope.  2. Urinary tract infection.  3. 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  4. History of carcinoma of breast.  5. Cirrhosis of the liver.  6. Hypertension.  7. History of hypothyroidism.  8. Lung nodule.   Miranda Blackburn is a 77 year old female with a history of  atrial  fibrillation, hypertension, hypothyroidism, and history of CA of breast,  presented with the complaints of having dizziness and almost losing  consciousness, though she did not, at her PCPs office.  The patient was  admitted to the telemetry floor.  Serial cardiac enzymes and EKG were  done.  EKG showed LVBBB and cardiac enzymes were within acceptable, and  hence Cardiology was consulted.  The patient has a history of a UTI for  which antibiotics have been started.  A CT of the chest was done, which  showed features of MAI at this time, and Infectious Disease has been  consulted  was noted, blood cultures, and to continue with the  ceftriaxone per Cardiology at this time.  A stress test has been  ordered, which is pending for tomorrow.   PROCEDURES DURING HER STAY:  1. CT chest with contrast on April 22, 2008.  Impression:      Branching nodular pattern in the right middle lobe and right lower      lobe corresponds to plain film abnormalities as an infectious or      inflammatory pattern, and Mycobacterium avium-intracellulare in the      patient population.  Similar findings in the left lung to a lesser      degree, and patient has a history of breast cancer.  Cannot exclude      neoplasm  as there are no.  Recommend CT thorax repeat in 3 to 6      months to precordial space and gastro of uncertain      etiologywarranted with a history of breast cancer and liver      cirrhosis.  2. Chest x-ray, April 21, 2008:  Bronchitic changes with      questionable 5 mm diameter right middle lung nodule pr calcified      granuloma of the lung.  Followup PA and lateral of chest      recommended.   PLAN:  The patient is scheduled for a stress test.  The patient was  recommended that she will need a repeat CT chest within 3 months to  follow up on her lung nodules.  This was discussed with the radiologist  about the CAT scan findings, and they want an initial workup for MAI  first as a priority.   The discharging medications and followup instructions and procedures per  the discharging  MD.      Eduard Clos, MD  Electronically Signed     ANK/MEDQ  D:  04/24/2008  T:  04/24/2008  Job:  [id number]

## 2010-10-16 NOTE — Discharge Summary (Signed)
NAMEJUNA, CABAN              ACCOUNT NO.:  [id number]   MEDICAL RECORD NO.:  [id number]          PATIENT TYPE:  INP   LOCATION:  3734                         FACILITY:  [facility]   PHYSICIAN:  Ladell Pier, M.D.   DATE OF BIRTH:  Nov 19, 1926   DATE OF ADMISSION:  04/21/2008  DATE OF DISCHARGE:                               DISCHARGE SUMMARY   ADDENDUM:  1. Near-syncope.  The patient had a stress test done per cardiology.      She is scheduled to have a 2-D echo today.  If the echo is      negative, she will be discharged home and follow up with      cardiology, Dr. Johney Frame, outpatient, and will also have an event      monitor placed per cardiology outpatient.  Her lisinopril was held      for now.  2. Question of a MAI infection.  The patient will be seen by Dr.      Orvan Falconer today prior to discharge and the treatment method, if any,      will be determined.  3. History of breast cancer with pulmonary nodule.  Per Dr.      Katherene Ponto note, per radiology recommendation and discussion with      radiology, the patient to have a follow-up CAT scan of the chest in      3 months with her history of breast cancer.  4. UTI.  The patient was treated with a 3-day course of Rocephin IV.   DISCHARGE LABS:  Acute hepatitis panel negative.  Blood cultures  negative.  Sodium 133, potassium 4.1, chloride 104, CO2 25, glucose 80,  BUN 6, creatinine 0.79, calcium 8.3.  TSH 2.968.  WBC 4.1, hemoglobin  11.3, platelets 140.   DISCHARGE MEDICATIONS:  1. Levothyroxine 50 mcg daily.  2. Aspirin 81 mg daily.  3. Ambien 5 mg at bedtime as needed.  4. Allegra 180 mg as needed.  5. Calcium and vitamin D 1200 mg at bedtime.  6. Vitamin C 250 mg daily.  7. Alendronate 70 mg every Sunday.   FOLLOWUP APPOINTMENTS:  The patient to follow up with [facility] Thursday,  The patient states that she will also follow  up with the primary care doctor she met at Urgent Care, who she  really  likes.  She will follow up there, get established as a new patient since  she recently moved from [place].      Ladell Pier, M.D.  Electronically Signed     NJ/MEDQ  D:  04/25/2008  T:  04/25/2008  Job:  [id number]

## 2010-10-16 NOTE — Assessment & Plan Note (Signed)
[facility]                         ELECTROPHYSIOLOGY OFFICE NOTE   NAME:Miranda Blackburn, Miranda Blackburn                     MRN:          [id number]  DATE:06/09/2008                            DOB:          08-12-1926    INTRODUCTION:  Miranda Blackburn is a pleasant 77 year old female with a  history of orthostatic presyncope, who was recently hospitalized at  [facility] from April 21, 2008, through April 25, 2008,  who presents today for cardiology followup.   PROBLEM LIST:  1. Orthostatic presyncope.  2. Prior atrioventricular nodal reentrant tachycardia status post slow      atrioventricular nodal pathway ablation in [place].  3. Hypothyroidism.  4. Mycobacterium avium-intracellulare infection.  5. History of breast cancer with pulmonary nodules.  6. Hypertension.  7. Migraine headaches.  8. Vertigo.  9. Anemia.  10.Hyponatremia.  11.History of hepatitis.  12.Recurrent urinary tract infections.  13.Degenerative joint disease.  14.Anxiety.   CURRENT MEDICATIONS:  1. Allegra 180 mg daily.  2. Lisinopril 5 mg daily.  3. Alendronate 70 mg daily.  4. Levothyroxine 0.05 mg daily.  5. Vitamin C 250 mg daily.  6. Calcium plus vitamin D b.i.d.  7. Aspirin 81 mg daily.  8. Ambien 10 mg nightly p.r.n.   INTERVAL HISTORY:  Miranda Blackburn presents today for followup after a  recent hospitalization for orthostatic presyncope.  During her hospital  stay, she was found to be orthostatic and received IV fluids.  She was  observed on telemetry with no arrhythmias documented.  She had an  echocardiogram which revealed a preserved left ventricular ejection  fraction as well as a nuclear study which revealed no evidence of  ischemia.  She has done well since hospital discharge.  She denies any  significant palpitations, presyncope, or syncope.  She denies chest  pain, shortness of breath, orthopnea, PND, or other concerns.  She  chronically has difficulties  with weight loss.  She reports that over  the past few months she has had a nonproductive cough which typically is  worse at night.  She also notes an occasional lump in her throat when  swallowing.  She has an ongoing evaluation through her primary care  physician at [facility].  She is also scheduled to  follow up with Infectious Disease for presumed Mycobacterium avium-  intracellulare complex infection.   PHYSICAL EXAMINATION:  VITALS:  Blood pressure lying 150/86 with a heart  rate of 71, sitting 158/90 with a heart rate is 74, and standing 125/78  with a heart rate of 92, weight 118 pounds.  GENERAL:  The patient is a thin female in no acute distress.  She is  alert and oriented x3.  HEENT:  Normocephalic, atraumatic.  Sclerae clear.  Conjunctivae pink.  Oropharynx clear.  NECK:  Supple.  No JVD, lymphadenopathy, thyromegaly, or bruits.  LUNGS:  Clear to auscultation bilaterally.  HEART:  Regular rate and rhythm.  No murmurs, rubs, or gallops.  GI:  Soft, nontender, nondistended.  Positive bowel sounds.  EXTREMITIES:  No clubbing, cyanosis, or edema.  NEUROLOGIC:  Strength and sensation are intact.  SKIN:  No ecchymoses or lacerations.   Labs obtained by the patient's primary care physician on May 11, 2008, revealed a sodium of 133, potassium 4.2, glucose 83, BUN 13,  creatinine 0.8, bilirubin 1.2, alkaline phosphatase 193, AST 62, ALT 35,  albumin 3.1, total cholesterol 199, triglycerides 68, HDL 52, LDL 14,  TSH 2.49.   Echocardiogram on April 25, 2008, reveals a left ventricular end-  diastolic dimension of 36.7 with a left atrial size of 37 mm.  The left  ventricular ejection fraction was 60% with no significant wall motion  abnormalities.  Focal basal septal hypertrophy is observed.  No  significant valvular abnormalities were appreciated.   Adenosine Myoview stress test on April 25, 2008, revealed an ejection  fraction of 76% with no  evidence of ischemia.   EKG obtained on April 21, 2008, reveals sinus rhythm at 68 beats per  minute with a left bundle-branch block.   Holter monitor placed on May 02, 2008, through May 27, 2008,  reveals predominantly sinus rhythm with a left bundle-branch block.  A  supraventricular tachycardia was observed on May 03, 2008, at 2:59  p.m. with an average ventricular rate of 156 beats per minute.  The  patient denies symptoms during this episode.  No other arrhythmias were  observed.   IMPRESSION:  Miranda Blackburn is a pleasant female with a history of  orthostatic presyncope, who presents today for cardiology followup.  She  is again orthostatic in clinic today, but has had no further presyncopal  episodes.  She denies any palpitations or symptomatic arrhythmias though  she did have a supraventricular tachycardia documented by Holter on  May 03, 2008, which likely represents a supraventricular  tachycardia.  She reports a progressive cough over the past few months  which she thinks has worsened since initiation of lisinopril.  She is  otherwise without symptoms of ischemia or heart failure.   PLAN:  1. The patient's lisinopril was discontinued today, and we will      initiate Diovan 80 mg daily which the patient previously has      tolerated without cough.  2. As the patient has not had symptoms with her arrhythmia, I have not      initiated any medical therapy for this at the present time.  The      patient is aware to contact me should she have further symptomatic      arrhythmias.  3. I have recommended the patient increase her oral fluid consumption      as well as liberalized salt diet for symptoms of orthostatic      dizziness.  4. The patient will follow up with Infectious Disease for treatment of      her MAI infection over the next few weeks.  5. The patient will continue to follow up closely with her primary      care physician for further evaluation  of elevated alkaline      phosphatase, weight loss, and pulmonary nodules in the setting of a      history of breast cancer.  6. The patient will followup in my clinic in 6 months.     Hillis Range, MD  Electronically Signed    JA/MedQ  DD: 06/09/2008  DT: 06/10/2008  Job #: [id number]   cc:   Kendrick Ranch, M.D.

## 2010-10-23 ENCOUNTER — Encounter (HOSPITAL_BASED_OUTPATIENT_CLINIC_OR_DEPARTMENT_OTHER): Payer: Medicare PPO | Admitting: Oncology

## 2010-10-23 DIAGNOSIS — D649 Anemia, unspecified: Secondary | ICD-10-CM

## 2010-10-23 DIAGNOSIS — Z853 Personal history of malignant neoplasm of breast: Secondary | ICD-10-CM

## 2011-01-28 ENCOUNTER — Other Ambulatory Visit: Payer: Self-pay | Admitting: Internal Medicine

## 2011-03-05 LAB — CK TOTAL AND CKMB (NOT AT ARMC)
CK, MB: 4 ng/mL (ref 0.3–4.0)
Relative Index: 3.5 — ABNORMAL HIGH (ref 0.0–2.5)
Total CK: 115 U/L (ref 7–177)

## 2011-03-05 LAB — CBC
HCT: 31.8 % — ABNORMAL LOW (ref 36.0–46.0)
Hemoglobin: 11.3 g/dL — ABNORMAL LOW (ref 12.0–15.0)
Hemoglobin: 12.1 g/dL (ref 12.0–15.0)
MCHC: 34.4 g/dL (ref 30.0–36.0)
MCV: 95.1 fL (ref 78.0–100.0)
Platelets: 140 10*3/uL — ABNORMAL LOW (ref 150–400)
RBC: 3.69 MIL/uL — ABNORMAL LOW (ref 3.87–5.11)
RDW: 14.6 % (ref 11.5–15.5)
WBC: 4.1 10*3/uL (ref 4.0–10.5)

## 2011-03-05 LAB — CARDIAC PANEL(CRET KIN+CKTOT+MB+TROPI)
CK, MB: 4.3 ng/mL — ABNORMAL HIGH (ref 0.3–4.0)
Total CK: 130 U/L (ref 7–177)
Troponin I: 0.01 ng/mL (ref 0.00–0.06)

## 2011-03-05 LAB — HEPATIC FUNCTION PANEL
Alkaline Phosphatase: 182 U/L — ABNORMAL HIGH (ref 39–117)
Bilirubin, Direct: 0.2 mg/dL (ref 0.0–0.3)
Total Bilirubin: 0.8 mg/dL (ref 0.3–1.2)

## 2011-03-05 LAB — BASIC METABOLIC PANEL
BUN: 11 mg/dL (ref 6–23)
Calcium: 8.3 mg/dL — ABNORMAL LOW (ref 8.4–10.5)
Chloride: 95 mEq/L — ABNORMAL LOW (ref 96–112)
Creatinine, Ser: 0.8 mg/dL (ref 0.4–1.2)
Creatinine, Ser: 0.95 mg/dL (ref 0.4–1.2)
GFR calc Af Amer: >60 mL/min (ref >60)
GFR calc Af Amer: >60 mL/min (ref >60)
GFR calc non Af Amer: 57 mL/min — ABNORMAL LOW (ref >60)
GFR calc non Af Amer: >60 mL/min (ref >60)
GFR calc non Af Amer: >60 mL/min (ref >60)
Glucose, Bld: 80 mg/dL (ref 70–99)
Glucose, Bld: 80 mg/dL (ref 70–99)
Potassium: 4.1 mEq/L (ref 3.5–5.1)
Potassium: 4.8 mEq/L (ref 3.5–5.1)
Sodium: 133 mEq/L — ABNORMAL LOW (ref 135–145)

## 2011-03-05 LAB — HEPATITIS PANEL, ACUTE
HCV Ab: NEGATIVE
Hep A IgM: NEGATIVE
Hep B C IgM: NEGATIVE
Hepatitis B Surface Ag: NEGATIVE

## 2011-03-05 LAB — POCT CARDIAC MARKERS
CKMB, poc: 1.8 ng/mL (ref 1.0–8.0)
CKMB, poc: 2.5 ng/mL (ref 1.0–8.0)
Myoglobin, poc: 138 ng/mL (ref 12–200)
Troponin i, poc: <0.05 ng/mL (ref 0.00–0.09)
Troponin i, poc: <0.05 ng/mL (ref 0.00–0.09)

## 2011-03-05 LAB — URINALYSIS, ROUTINE W REFLEX MICROSCOPIC
Glucose, UA: NEGATIVE mg/dL
Nitrite: NEGATIVE
Protein, ur: NEGATIVE mg/dL
Urobilinogen, UA: 0.2 mg/dL (ref 0.0–1.0)

## 2011-03-05 LAB — CULTURE, BLOOD (SINGLE): Culture: NO GROWTH

## 2011-03-05 LAB — AFB CULTURE WITH SMEAR (NOT AT ARMC): Acid Fast Smear: NONE SEEN

## 2011-03-05 LAB — TSH: TSH: 2.968 u[IU]/mL (ref 0.350–4.500)

## 2011-03-05 LAB — URINE MICROSCOPIC-ADD ON

## 2011-03-05 LAB — TROPONIN I: Troponin I: 0.02 ng/mL (ref 0.00–0.06)

## 2011-04-03 ENCOUNTER — Other Ambulatory Visit: Payer: Self-pay | Admitting: Internal Medicine

## 2011-06-04 ENCOUNTER — Ambulatory Visit (INDEPENDENT_AMBULATORY_CARE_PROVIDER_SITE_OTHER): Payer: Medicare PPO

## 2011-06-04 DIAGNOSIS — S8010XA Contusion of unspecified lower leg, initial encounter: Secondary | ICD-10-CM

## 2011-06-06 ENCOUNTER — Other Ambulatory Visit: Payer: Self-pay | Admitting: Internal Medicine

## 2011-06-06 MED ORDER — VALSARTAN 80 MG PO TABS: 40.0000 mg | ORAL_TABLET | Freq: Every day | ORAL | Status: DC

## 2011-11-12 ENCOUNTER — Other Ambulatory Visit: Payer: Self-pay

## 2011-12-02 ENCOUNTER — Encounter: Payer: Self-pay | Admitting: Internal Medicine

## 2011-12-02 ENCOUNTER — Ambulatory Visit (INDEPENDENT_AMBULATORY_CARE_PROVIDER_SITE_OTHER): Payer: Medicare PPO | Admitting: Internal Medicine

## 2011-12-02 VITALS — BP 142/74 | HR 81 | Ht 64.0 in | Wt 117.0 lb

## 2011-12-02 DIAGNOSIS — I447 Left bundle-branch block, unspecified: Secondary | ICD-10-CM

## 2011-12-02 NOTE — Patient Instructions (Addendum)

## 2011-12-02 NOTE — Assessment & Plan Note (Signed)
With abdominal swelling and mild edema, I think that we should repeat an echo. Echo from 2009 is reviewed No changes today

## 2011-12-02 NOTE — Assessment & Plan Note (Signed)
Stable No change required today  

## 2011-12-02 NOTE — Progress Notes (Signed)
PCP: Cliffton Asters, MD  Miranda Blackburn is a 78 y.o. female who presents today for routine cardiology followup.  Since last being seen in our clinic, the patient reports doing reasonably well. She remains active for her age.  She has been diagnosed with cirrhosis.  She reports mild abdominal swelling and edema.  Today, she denies symptoms of palpitations, chest pain, shortness of breath, dizziness, presyncope, or syncope.  The patient is otherwise without complaint today.   Past Medical History  Diagnosis Date  . Orthostatic syncope   . Atrioventricular nodal re-entry tachycardia     s/p ablation in [place]  . Hypothyroidism   . Mycobacterium avium-intracellulare infection   . HX: breast cancer   . HTN (hypertension)   . Migraine headache   . Vertigo   . Anemia   . Hyponatremia   . History of hepatitis   . Recurrent UTI   . DJD (degenerative joint disease)   . Anxiety   . LBBB (left bundle branch block)   . Cirrhosis    Past Surgical History  Procedure Date  . Cholecystectomy   . Carpal tunnel release   . Hemorrhoid surgery   . Breast lumpectomy   . Tubal ligation   . Tonsillectomy   . Bunionectomy   . Slow pathway modification     AVNRT     Current Outpatient Prescriptions  Medication Sig Dispense Refill  . Ascorbic Acid (VITAMIN C) 500 MG tablet Take 250 mg by mouth daily.        . Calcium Carbonate-Vitamin D (CALCIUM 600/VITAMIN D) 600-400 MG-UNIT per tablet Take 1 tablet by mouth daily.        . Cholecalciferol (VITAMIN D) 1000 UNITS capsule Take 1,000 Units by mouth daily.        . fexofenadine (ALLEGRA) 180 MG tablet Take 180 mg by mouth daily.        Marland Kitchen levothyroxine (SYNTHROID, LEVOTHROID) 50 MCG tablet Take 50 mcg by mouth daily.        . Potassium 99 MG TABS 1 tab po qd       . valsartan (DIOVAN) 80 MG tablet Take 0.5 tablets (40 mg total) by mouth daily.  30 tablet  6  . zolpidem (AMBIEN) 10 MG tablet Take 10 mg by mouth at bedtime as needed.           Physical Exam: Filed Vitals:   12/02/11 1518  BP: 142/74  Pulse: 81  Height: 5\' 4"  (1.626 m)  Weight: 117 lb (53.071 kg)    GEN- The patient is well appearing, alert and oriented x 3 today.   Head- normocephalic, atraumatic Eyes-  Sclera clear, conjunctiva pink Ears- hearing intact Oropharynx- clear Lungs- Clear to ausculation bilaterally, normal work of breathing Heart- Regular rate and rhythm, no murmurs, rubs or gallops, PMI not laterally displaced GI- soft, NT, mildly distended, + BS Extremities- no clubbing, cyanosis, trace edema  Sinus rhythm 81 bpm, PR 202, LBBB (old)  Assessment and Plan:

## 2011-12-11 ENCOUNTER — Ambulatory Visit (HOSPITAL_COMMUNITY): Payer: Medicare PPO | Attending: Internal Medicine | Admitting: Radiology

## 2011-12-11 DIAGNOSIS — R609 Edema, unspecified: Secondary | ICD-10-CM | POA: Insufficient documentation

## 2011-12-11 DIAGNOSIS — I059 Rheumatic mitral valve disease, unspecified: Secondary | ICD-10-CM | POA: Insufficient documentation

## 2011-12-11 DIAGNOSIS — Z923 Personal history of irradiation: Secondary | ICD-10-CM | POA: Insufficient documentation

## 2011-12-11 DIAGNOSIS — I10 Essential (primary) hypertension: Secondary | ICD-10-CM | POA: Insufficient documentation

## 2011-12-11 DIAGNOSIS — I498 Other specified cardiac arrhythmias: Secondary | ICD-10-CM | POA: Insufficient documentation

## 2011-12-11 DIAGNOSIS — C50919 Malignant neoplasm of unspecified site of unspecified female breast: Secondary | ICD-10-CM | POA: Insufficient documentation

## 2011-12-11 DIAGNOSIS — I447 Left bundle-branch block, unspecified: Secondary | ICD-10-CM

## 2011-12-11 NOTE — Progress Notes (Signed)
Echocardiogram performed.  

## 2012-03-26 ENCOUNTER — Institutional Professional Consult (permissible substitution): Payer: Medicare PPO | Admitting: Critical Care Medicine

## 2013-09-24 ENCOUNTER — Ambulatory Visit (INDEPENDENT_AMBULATORY_CARE_PROVIDER_SITE_OTHER): Payer: Medicare PPO | Admitting: Internal Medicine

## 2013-09-24 ENCOUNTER — Encounter: Payer: Self-pay | Admitting: Internal Medicine

## 2013-09-24 VITALS — BP 140/79 | HR 60 | Ht 64.0 in | Wt 111.0 lb

## 2013-09-24 DIAGNOSIS — I10 Essential (primary) hypertension: Secondary | ICD-10-CM

## 2013-09-24 DIAGNOSIS — I447 Left bundle-branch block, unspecified: Secondary | ICD-10-CM

## 2013-09-24 DIAGNOSIS — R0602 Shortness of breath: Secondary | ICD-10-CM

## 2013-09-24 NOTE — Patient Instructions (Signed)
Your physician recommends that you continue on your current medications as directed. Please refer to the Current Medication list given to you today.  Your physician has requested that you have an echocardiogram. Echocardiography is a painless test that uses sound waves to create images of your heart. It provides your doctor with information about the size and shape of your heart and how well your heart's chambers and valves are working. This procedure takes approximately one hour. There are no restrictions for this procedure.  Your physician has requested that you have a lexiscan myoview. For further information please visit [email or web address]. Please follow instruction sheet, as given.  Your physician recommends that you schedule a follow-up appointment in: 4 months with Truitt Merle, NP.  Your physician wants you to follow-up in: 1 year with Dr. Rayann Heman. You will receive a reminder letter in the mail two months in advance. If you don't receive a letter, please call our office to schedule the follow-up appointment.

## 2013-09-26 DIAGNOSIS — R0602 Shortness of breath: Secondary | ICD-10-CM | POA: Insufficient documentation

## 2013-09-26 NOTE — Progress Notes (Signed)
PCP: Michel Bickers, MD  Miranda Blackburn is a 80 y.o. female who presents today for cardiology followup.  Since last being seen in our clinic, the patient reports doing reasonably well.  She has been diagnosed with cirrhosis and has received treatment for varices.  She has mild edema.  Her concern today is with progressive SOB.  She reports SOB with mild to moderate activity.  She has occasional chest tightness. Today, she denies symptoms of palpitations,  dizziness, presyncope, or syncope.  The patient is otherwise without complaint today.   Past Medical History  Diagnosis Date  . Orthostatic syncope   . Atrioventricular nodal re-entry tachycardia     s/p ablation in [place]  . Hypothyroidism   . Mycobacterium avium-intracellulare infection   . HX: breast cancer   . HTN (hypertension)   . Migraine headache   . Vertigo   . Anemia   . Hyponatremia   . History of hepatitis   . Recurrent UTI   . DJD (degenerative joint disease)   . Anxiety   . LBBB (left bundle branch block)   . Cirrhosis    Past Surgical History  Procedure Laterality Date  . Cholecystectomy    . Carpal tunnel release    . Hemorrhoid surgery    . Breast lumpectomy    . Tubal ligation    . Tonsillectomy    . Bunionectomy    . Slow pathway modification      AVNRT     Current Outpatient Prescriptions  Medication Sig Dispense Refill  . Calcium Carbonate-Vitamin D (CALCIUM 600/VITAMIN D) 600-400 MG-UNIT per tablet Take 1 tablet by mouth daily.        . fexofenadine (ALLEGRA) 180 MG tablet Take 180 mg by mouth as needed.       . furosemide (LASIX) 20 MG tablet Take 1 tablet by mouth daily.      Marland Kitchen levothyroxine (SYNTHROID, LEVOTHROID) 50 MCG tablet Take 50 mcg by mouth daily.        Marland Kitchen omeprazole (PRILOSEC) 40 MG capsule Take 1 capsule by mouth daily.      . propranolol (INDERAL) 10 MG tablet Take 1 tablet by mouth daily.       Marland Kitchen spironolactone (ALDACTONE) 50 MG tablet Take 1 tablet by mouth daily.      . ursodiol  (ACTIGALL) 250 MG tablet Take 1 tablet by mouth 3 (three) times daily.      . valsartan (DIOVAN) 40 MG tablet Take 40 mg by mouth daily.      Marland Kitchen zolpidem (AMBIEN) 10 MG tablet Take 5 mg by mouth at bedtime as needed.        No current facility-administered medications for this visit.    Physical Exam: Filed Vitals:   09/24/13 1221  BP: 140/79  Pulse: 60  Height: 5\' 4"  (1.626 m)  Weight: 111 lb (50.349 kg)    GEN- The patient is well appearing, alert and oriented x 3 today.   Head- normocephalic, atraumatic Eyes-  Sclera clear, conjunctiva pink Ears- hearing intact Oropharynx- clear Lungs- Clear to ausculation bilaterally, normal work of breathing Heart- Regular rate and rhythm, no murmurs, rubs or gallops, PMI not laterally displaced GI- soft, NT, mildly distended, + BS Extremities- no clubbing, cyanosis, trace edema  Sinus rhythm 60 bpm, PR 226, LBBB (old)  Assessment and Plan:  1. SOB/ chest tightness  Concerning for angina, though could also be related to her cirrhosis. I will repeat her echo and also obtain a lexiscan  myoview.  Her LBBB is a chronic finding.  2. htn Stable No change required today Salt restriction advised  Return to see Truitt Merle in 4 weeks to follow-up on echo and lexiscan.  I will see in a year unless problems arise in the interim

## 2013-10-13 ENCOUNTER — Encounter: Payer: Self-pay | Admitting: Internal Medicine

## 2013-10-18 ENCOUNTER — Other Ambulatory Visit (HOSPITAL_COMMUNITY): Payer: Self-pay | Admitting: *Deleted

## 2013-10-18 ENCOUNTER — Ambulatory Visit (HOSPITAL_COMMUNITY): Payer: Medicare PPO | Attending: Cardiovascular Disease | Admitting: Cardiology

## 2013-10-18 ENCOUNTER — Ambulatory Visit (HOSPITAL_BASED_OUTPATIENT_CLINIC_OR_DEPARTMENT_OTHER): Payer: Medicare PPO | Admitting: Radiology

## 2013-10-18 VITALS — BP 135/89 | HR 83 | Ht 64.0 in | Wt 109.0 lb

## 2013-10-18 DIAGNOSIS — I447 Left bundle-branch block, unspecified: Secondary | ICD-10-CM

## 2013-10-18 DIAGNOSIS — R079 Chest pain, unspecified: Secondary | ICD-10-CM

## 2013-10-18 DIAGNOSIS — R0602 Shortness of breath: Secondary | ICD-10-CM

## 2013-10-18 DIAGNOSIS — R0989 Other specified symptoms and signs involving the circulatory and respiratory systems: Secondary | ICD-10-CM | POA: Insufficient documentation

## 2013-10-18 DIAGNOSIS — R0609 Other forms of dyspnea: Secondary | ICD-10-CM | POA: Insufficient documentation

## 2013-10-18 MED ORDER — TECHNETIUM TC 99M SESTAMIBI GENERIC - CARDIOLITE
10.0000 | Freq: Once | INTRAVENOUS | Status: AC | PRN
  Administered 2013-10-18: 10 via INTRAVENOUS

## 2013-10-18 MED ORDER — TECHNETIUM TC 99M SESTAMIBI GENERIC - CARDIOLITE
30.0000 | Freq: Once | INTRAVENOUS | Status: AC | PRN
  Administered 2013-10-18: 30 via INTRAVENOUS

## 2013-10-18 MED ORDER — ADENOSINE (DIAGNOSTIC) 3 MG/ML IV SOLN
0.5600 mg/kg | Freq: Once | INTRAVENOUS | Status: AC
  Administered 2013-10-18: 27.6 mg via INTRAVENOUS

## 2013-10-18 NOTE — Progress Notes (Signed)
Echo performed. 

## 2013-10-18 NOTE — Progress Notes (Signed)
[facility] [address] [place], [place] [address] [phone]    Cardiology Nuclear Med Study  Miranda Blackburn is a 80 y.o. female     MRN : [id number]     DOB: 20-Oct-1926  Procedure Date: 10/18/2013  Nuclear Med Background Indication for Stress Test:  Evaluation for Ischemia History:  No known CAD, Echo 2013 EF 55-60%, MPI 2009 (normal) EF 76%, COPD Cardiac Risk Factors: Hypertension and LBBB  Symptoms:  Chest Pain, DOE and SOB   Nuclear Pre-Procedure Caffeine/Decaff Intake:  None NPO After: 10:00pm   Lungs:  clear O2 Sat: 96% on room air. IV 0.9% NS with Angio Cath:  22g  IV Site: R Wrist  IV Started by:  Matilde Haymaker, RN  Chest Size (in):  34 Cup Size: B  Height: 5\' 4"  (1.626 m)  Weight:  109 lb (49.442 kg)  BMI:  Body mass index is 18.7 kg/(m^2). Tech Comments:  n/a    Nuclear Med Study 1 or 2 day study: 1 day  Stress Test Type:  Adenosine  Reading MD: n/a  Order Authorizing Provider:  Trude Mcburney  Resting Radionuclide: Technetium 75m Sestamibi  Resting Radionuclide Dose: 11.0 mCi   Stress Radionuclide:  Technetium 67m Sestamibi  Stress Radionuclide Dose: 33.0 mCi           Stress Protocol Rest HR: 83 Stress HR: 96  Rest BP: 135/89 Stress BP: 143/71  Exercise Time (min): n/a METS: n/a           Dose of Adenosine (mg):  27.7 mg Dose of Lexiscan: n/a mg  Dose of Atropine (mg): n/a Dose of Dobutamine: n/a mcg/kg/min (at max HR)  Stress Test Technologist: Glade Lloyd, BS-ES  Nuclear Technologist:  Annye Rusk, CNMT     Rest Procedure:  Myocardial perfusion imaging was performed at rest 45 minutes following the intravenous administration of Technetium 19m Sestamibi. Rest ECG: Normal sinus rhythm. Interventricular conduction delay of the left lung which type  Stress Procedure:  The patient received IV adenosine at 140 mcg/kg/min for 4 minutes.  Technetium 37m Sestamibi was injected at the 2 minute mark and quantitative  spect images were obtained after a 45 minute delay. Stress ECG: No significant change from baseline ECG  QPS Raw Data Images:  Normal; no motion artifact; normal heart/lung ratio. Stress Images:  There is question on the quantitative analysis of mild decreased activity in the septum. I feel that this is not a real finding. There is no diagnostic abnormality. This could possibly be related to the left bundle branch block. Rest Images:  Rest images are the same as stress images. Subtraction (SDS):  No evidence of ischemia. Transient Ischemic Dilatation (Normal <1.22):  1.21 Lung/Heart Ratio (Normal <0.45):  0.25  Quantitative Gated Spect Images QGS EDV:  54 ml QGS ESV:  13 ml  Impression Exercise Capacity:  Adenosine study with no exercise. BP Response:  Normal blood pressure response. Clinical Symptoms:  No significant symptoms noted. ECG Impression:  No significant ST segment change suggestive of ischemia. Comparison with Prior Nuclear Study: There is no significant change from the report of the study from November, 2009.  Overall Impression:  This is a normal study. This is a low risk scan. There may be slight decreased activity in the septum that may be related to the left bundle branch block. There is no scar or ischemia.  LV Ejection Fraction: 76%.  LV Wall Motion:  Normal Wall Motion.  [name]  [name], MD

## 2014-01-24 ENCOUNTER — Encounter: Payer: Self-pay | Admitting: Nurse Practitioner

## 2014-01-24 ENCOUNTER — Ambulatory Visit (INDEPENDENT_AMBULATORY_CARE_PROVIDER_SITE_OTHER): Payer: Medicare PPO | Admitting: Nurse Practitioner

## 2014-01-24 VITALS — BP 140/70 | HR 64 | Ht 64.5 in | Wt 110.4 lb

## 2014-01-24 DIAGNOSIS — I447 Left bundle-branch block, unspecified: Secondary | ICD-10-CM

## 2014-01-24 DIAGNOSIS — I10 Essential (primary) hypertension: Secondary | ICD-10-CM

## 2014-01-24 NOTE — Patient Instructions (Signed)
See Dr. Rayann Heman in May of 2016  Stay on your current medicines  Call the [facility] office at [phone] if you have any questions, problems or concerns.

## 2014-01-24 NOTE — Progress Notes (Signed)
Miranda Blackburn Date of Birth: [birth year] Medical Record #[id number]  History of Present Illness: Miranda Blackburn is seen back today for a 4 month check. Seen for Dr. Rayann Heman. 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   Seen 4 months ago - had had some chest pain. Echo and Lexiscan updated. I was suppose to see her back in 4 weeks.   Comes back today. Here alone. She is doing ok from our standpoint. Has been to [place] to care for her sister (who is 59). Miranda Blackburn got sick with UTI, vertigo and ended up getting Cdiff. Found out that she was allergic to the red dye in the Azo that she took OTC. No chest pain. Not short of breath. Rhythm ok. Her labs are checked by her PCP - Dr. Baruch Gouty.    Current Outpatient Prescriptions  Medication Sig Dispense Refill  . Calcium Carbonate-Vitamin D (CALCIUM 600/VITAMIN D) 600-400 MG-UNIT per tablet Take 1 tablet by mouth daily.        . fexofenadine (ALLEGRA) 180 MG tablet Take 180 mg by mouth as needed.       . furosemide (LASIX) 20 MG tablet Take 1 tablet by mouth daily.      Marland Kitchen levothyroxine (SYNTHROID, LEVOTHROID) 50 MCG tablet Take 50 mcg by mouth daily.        . propranolol (INDERAL) 10 MG tablet Take 1 tablet by mouth daily.       Marland Kitchen spironolactone (ALDACTONE) 50 MG tablet Take 1 tablet by mouth daily.      . valsartan (DIOVAN) 40 MG tablet Take 40 mg by mouth daily.      Marland Kitchen zolpidem (AMBIEN) 10 MG tablet Take 5 mg by mouth at bedtime as needed.        No current facility-administered medications for this visit.    Allergies  Allergen Reactions  . Penicillins   . Red Dye     vomiting  . Sulfonamide Derivatives     Past Medical History  Diagnosis Date  . Orthostatic syncope   . Atrioventricular nodal re-entry tachycardia     s/p ablation in [place]  . Hypothyroidism   . Mycobacterium avium-intracellulare infection   . HX: breast cancer   . HTN  (hypertension)   . Migraine headache   . Vertigo   . Anemia   . Hyponatremia   . History of hepatitis   . Recurrent UTI   . DJD (degenerative joint disease)   . Anxiety   . LBBB (left bundle branch block)   . Cirrhosis     Past Surgical History  Procedure Laterality Date  . Cholecystectomy    . Carpal tunnel release    . Hemorrhoid surgery    . Breast lumpectomy    . Tubal ligation    . Tonsillectomy    . Bunionectomy    . Slow pathway modification      AVNRT     History  Smoking status  . Never Smoker   Smokeless tobacco  . Not on file    History  Alcohol Use No    Family History  Problem Relation Age of Onset  . Coronary artery disease    . Stroke    . Asthma      Review of Systems: The review of systems is per the HPI.  All other systems were reviewed and are negative.  Physical Exam: BP 140/70  Pulse  64  Ht 5' 4.5" (1.638 m)  Wt 110 lb 6.4 oz (50.077 kg)  BMI 18.66 kg/m2 Patient is very pleasant and in no acute distress. Looks younger than her stated age. She is thin. Skin is warm and dry. Color is normal.  HEENT is unremarkable. Normocephalic/atraumatic. PERRL. Sclera are nonicteric. Neck is supple. No masses. No JVD. Lungs are clear. Cardiac exam shows a regular rate and rhythm. Abdomen is a little protuberant but soft. Extremities are without edema. Gait and ROM are intact. No gross neurologic deficits noted.  Wt Readings from Last 3 Encounters:  01/24/14 110 lb 6.4 oz (50.077 kg)  10/18/13 109 lb (49.442 kg)  09/24/13 111 lb (50.349 kg)    LABORATORY DATA/PROCEDURES:  Lab Results  Component Value Date   WBC 3.9 10/16/2010   HGB 10.8* 10/16/2010   HCT 31.7* 10/16/2010   PLT 135* 10/16/2010   GLUCOSE 55* 10/16/2010   ALT 42* 10/16/2010   AST 67* 10/16/2010   NA 131* 10/16/2010   K 4.5 10/16/2010   CL 97 10/16/2010   CREATININE 0.83 10/16/2010   BUN 13 10/16/2010   CO2 21 10/16/2010   TSH 2.968 Test methodology is 3rd generation TSH 04/21/2008     BNP (last 3 results) No results found for this basename: PROBNP,  in the last 8760 hours  Echo Study Conclusions from May 2015  - Left ventricle: The cavity size was normal. Systolic function was normal. The estimated ejection fraction was in the range of 55% to 60%. Wall motion was normal; there were no regional wall motion abnormalities. Doppler parameters are consistent with abnormal left ventricular relaxation (grade 1 diastolic dysfunction). - Ventricular septum: Septal motion showed paradox. - Mitral valve: There was mild regurgitation. - Left atrium: The atrium was mildly dilated. - Atrial septum: No defect or patent foramen ovale was identified.  Myoview Impression from May 2015 Exercise Capacity: Adenosine study with no exercise.  BP Response: Normal blood pressure response.  Clinical Symptoms: No significant symptoms noted.  ECG Impression: No significant ST segment change suggestive of ischemia.  Comparison with Prior Nuclear Study: There is no significant change from the report of the study from November, 2009.  Overall Impression: This is a normal study. This is a low risk scan. There may be slight decreased activity in the septum that may be related to the left bundle branch block. There is no scar or ischemia.  LV Ejection Fraction: 76%. LV Wall Motion: Normal Wall Motion.  Carlena Bjornstad, MD      Assessment / Plan: 1. Past ablation for AVNRT -  Stable with no complaints of palpitations.  2. HTN -  BP satisfactory.   3. Chest pain - negative Myoview and satisfactory echo back in May - she is doing well clinically with no recurrence.   See back as planned next May. Labs are checked by her PCP.   Patient is agreeable to this plan and will call if any problems develop in the interim.   Burtis Junes, RN, [facility] [address] [place], [place]  [id number] [phone]

## 2014-02-26 ENCOUNTER — Encounter (HOSPITAL_BASED_OUTPATIENT_CLINIC_OR_DEPARTMENT_OTHER): Payer: Self-pay | Admitting: Emergency Medicine

## 2014-02-26 ENCOUNTER — Emergency Department (HOSPITAL_BASED_OUTPATIENT_CLINIC_OR_DEPARTMENT_OTHER): Payer: No Typology Code available for payment source

## 2014-02-26 ENCOUNTER — Inpatient Hospital Stay (HOSPITAL_BASED_OUTPATIENT_CLINIC_OR_DEPARTMENT_OTHER)
Admission: EM | Admit: 2014-02-26 | Discharge: 2014-03-02 | DRG: 183 | Disposition: A | Discharge status: Skilled Nursing Facility | Payer: No Typology Code available for payment source | Attending: General Surgery | Admitting: General Surgery

## 2014-02-26 DIAGNOSIS — W19XXXA Unspecified fall, initial encounter: Secondary | ICD-10-CM | POA: Diagnosis present

## 2014-02-26 DIAGNOSIS — S2239XA Fracture of one rib, unspecified side, initial encounter for closed fracture: Secondary | ICD-10-CM | POA: Diagnosis present

## 2014-02-26 DIAGNOSIS — E43 Unspecified severe protein-calorie malnutrition: Secondary | ICD-10-CM | POA: Diagnosis present

## 2014-02-26 DIAGNOSIS — Z880 Allergy status to penicillin: Secondary | ICD-10-CM

## 2014-02-26 DIAGNOSIS — Z8249 Family history of ischemic heart disease and other diseases of the circulatory system: Secondary | ICD-10-CM

## 2014-02-26 DIAGNOSIS — Z23 Encounter for immunization: Secondary | ICD-10-CM

## 2014-02-26 DIAGNOSIS — Z825 Family history of asthma and other chronic lower respiratory diseases: Secondary | ICD-10-CM

## 2014-02-26 DIAGNOSIS — D696 Thrombocytopenia, unspecified: Secondary | ICD-10-CM | POA: Diagnosis present

## 2014-02-26 DIAGNOSIS — S2249XA Multiple fractures of ribs, unspecified side, initial encounter for closed fracture: Principal | ICD-10-CM | POA: Diagnosis present

## 2014-02-26 DIAGNOSIS — M899 Disorder of bone, unspecified: Secondary | ICD-10-CM | POA: Diagnosis present

## 2014-02-26 DIAGNOSIS — I10 Essential (primary) hypertension: Secondary | ICD-10-CM | POA: Diagnosis present

## 2014-02-26 DIAGNOSIS — Z823 Family history of stroke: Secondary | ICD-10-CM

## 2014-02-26 DIAGNOSIS — R634 Abnormal weight loss: Secondary | ICD-10-CM | POA: Diagnosis present

## 2014-02-26 DIAGNOSIS — K7460 Unspecified cirrhosis of liver: Secondary | ICD-10-CM | POA: Diagnosis present

## 2014-02-26 DIAGNOSIS — S2242XA Multiple fractures of ribs, left side, initial encounter for closed fracture: Secondary | ICD-10-CM

## 2014-02-26 DIAGNOSIS — F411 Generalized anxiety disorder: Secondary | ICD-10-CM | POA: Diagnosis present

## 2014-02-26 DIAGNOSIS — S32509A Unspecified fracture of unspecified pubis, initial encounter for closed fracture: Secondary | ICD-10-CM | POA: Diagnosis present

## 2014-02-26 DIAGNOSIS — S329XXA Fracture of unspecified parts of lumbosacral spine and pelvis, initial encounter for closed fracture: Secondary | ICD-10-CM

## 2014-02-26 DIAGNOSIS — Z681 Body mass index (BMI) 19 or less, adult: Secondary | ICD-10-CM

## 2014-02-26 DIAGNOSIS — E039 Hypothyroidism, unspecified: Secondary | ICD-10-CM | POA: Diagnosis present

## 2014-02-26 DIAGNOSIS — S32599A Other specified fracture of unspecified pubis, initial encounter for closed fracture: Secondary | ICD-10-CM | POA: Diagnosis present

## 2014-02-26 DIAGNOSIS — M949 Disorder of cartilage, unspecified: Secondary | ICD-10-CM

## 2014-02-26 DIAGNOSIS — Z882 Allergy status to sulfonamides status: Secondary | ICD-10-CM

## 2014-02-26 DIAGNOSIS — M1990 Unspecified osteoarthritis, unspecified site: Secondary | ICD-10-CM | POA: Diagnosis present

## 2014-02-26 DIAGNOSIS — D62 Acute posthemorrhagic anemia: Secondary | ICD-10-CM | POA: Diagnosis present

## 2014-02-26 DIAGNOSIS — Z853 Personal history of malignant neoplasm of breast: Secondary | ICD-10-CM

## 2014-02-26 DIAGNOSIS — R079 Chest pain, unspecified: Secondary | ICD-10-CM | POA: Diagnosis not present

## 2014-02-26 DIAGNOSIS — Z888 Allergy status to other drugs, medicaments and biological substances status: Secondary | ICD-10-CM

## 2014-02-26 DIAGNOSIS — Y9229 Other specified public building as the place of occurrence of the external cause: Secondary | ICD-10-CM

## 2014-02-26 DIAGNOSIS — W010XXA Fall on same level from slipping, tripping and stumbling without subsequent striking against object, initial encounter: Secondary | ICD-10-CM | POA: Diagnosis present

## 2014-02-26 DIAGNOSIS — Z91018 Allergy to other foods: Secondary | ICD-10-CM

## 2014-02-26 DIAGNOSIS — M810 Age-related osteoporosis without current pathological fracture: Secondary | ICD-10-CM | POA: Diagnosis present

## 2014-02-26 LAB — CBC WITH DIFFERENTIAL/PLATELET
BASOS ABS: 0 10*3/uL (ref 0.0–0.1)
Basophils Relative: 0 % (ref 0–1)
EOS ABS: 0 10*3/uL (ref 0.0–0.7)
Eosinophils Relative: 1 % (ref 0–5)
HCT: 42.1 % (ref 36.0–46.0)
Hemoglobin: 14.4 g/dL (ref 12.0–15.0)
LYMPHS PCT: 17 % (ref 12–46)
Lymphs Abs: 0.6 10*3/uL — ABNORMAL LOW (ref 0.7–4.0)
MCH: 29.8 pg (ref 26.0–34.0)
MCHC: 34.2 g/dL (ref 30.0–36.0)
MCV: 87.2 fL (ref 78.0–100.0)
Monocytes Absolute: 0.4 10*3/uL (ref 0.1–1.0)
Monocytes Relative: 11 % (ref 3–12)
NEUTROS PCT: 71 % (ref 43–77)
Neutro Abs: 2.3 10*3/uL (ref 1.7–7.7)
PLATELETS: 62 10*3/uL — AB (ref 150–400)
RBC: 4.83 MIL/uL (ref 3.87–5.11)
RDW: 15.7 % — ABNORMAL HIGH (ref 11.5–15.5)
WBC: 3.3 10*3/uL — AB (ref 4.0–10.5)

## 2014-02-26 LAB — COMPREHENSIVE METABOLIC PANEL
ALT: 25 U/L (ref 0–35)
ANION GAP: 12 (ref 5–15)
AST: 48 U/L — AB (ref 0–37)
Albumin: 3.1 g/dL — ABNORMAL LOW (ref 3.5–5.2)
Alkaline Phosphatase: 101 U/L (ref 39–117)
BILIRUBIN TOTAL: 0.7 mg/dL (ref 0.3–1.2)
BUN: 35 mg/dL — ABNORMAL HIGH (ref 6–23)
CO2: 25 mEq/L (ref 19–32)
Calcium: 9 mg/dL (ref 8.4–10.5)
Chloride: 94 mEq/L — ABNORMAL LOW (ref 96–112)
Creatinine, Ser: 1.6 mg/dL — ABNORMAL HIGH (ref 0.50–1.10)
GFR calc Af Amer: 33 mL/min — ABNORMAL LOW (ref >90)
GFR calc non Af Amer: 28 mL/min — ABNORMAL LOW (ref >90)
Glucose, Bld: 90 mg/dL (ref 70–99)
Potassium: 4.4 mEq/L (ref 3.7–5.3)
SODIUM: 131 meq/L — AB (ref 137–147)
TOTAL PROTEIN: 8.7 g/dL — AB (ref 6.0–8.3)

## 2014-02-26 MED ORDER — SODIUM CHLORIDE 0.9 % IV SOLN
INTRAVENOUS | Status: DC
  Administered 2014-02-26: 22:00:00 via INTRAVENOUS

## 2014-02-26 MED ORDER — IOHEXOL 300 MG/ML  SOLN: 100.0000 mL | Freq: Once | INTRAMUSCULAR | Status: AC | PRN

## 2014-02-26 MED ORDER — SODIUM CHLORIDE 0.9 % IV BOLUS (SEPSIS): 200.0000 mL | Freq: Once | INTRAVENOUS | Status: DC

## 2014-02-26 MED ORDER — MORPHINE SULFATE 2 MG/ML IJ SOLN: 2.0000 mg | Freq: Once | INTRAMUSCULAR | Status: DC

## 2014-02-26 MED ORDER — MORPHINE SULFATE 4 MG/ML IJ SOLN: 4.0000 mg | Freq: Once | INTRAMUSCULAR | Status: DC

## 2014-02-26 MED ORDER — MORPHINE SULFATE 2 MG/ML IJ SOLN
2.0000 mg | Freq: Once | INTRAMUSCULAR | Status: AC
  Administered 2014-02-27: 2 mg via INTRAVENOUS
  Filled 2014-02-26 (×2): qty 1

## 2014-02-26 MED ORDER — ONDANSETRON HCL 4 MG/2ML IJ SOLN
4.0000 mg | Freq: Once | INTRAMUSCULAR | Status: AC
  Administered 2014-02-27: 4 mg via INTRAVENOUS
  Filled 2014-02-26 (×2): qty 2

## 2014-02-26 NOTE — ED Notes (Signed)
Pt reports falling on tile floor.  States that she hit the (L) side of her body.  Denies LOC.  Reports pain in her (L)-leg, hip, knee, flank, back and head/face are hurting.  Denies blood thinners.

## 2014-02-26 NOTE — ED Provider Notes (Signed)
CSN: [id number]     Arrival date & time 02/26/14  1733 History   First MD Initiated Contact with Patient 02/26/14 2102     Chief Complaint  Patient presents with  . Fall     (Consider location/radiation/quality/duration/timing/severity/associated sxs/prior Treatment) HPI This 80 year old female fell prior to arrival. The patient was at a restaurant going to her table and she reports that the floor was very slippery. Her feet went out from under her and she fell onto her left side particularly hitting the left side of her chest in her pelvis. She reports that she bumped her for head but did not have any loss of consciousness. The triage note mentions head and face hurting but she denies this to me. The patient is not on any anticoagulants. She reports that the left side of her chest and her back hurt a lot. Movements and deep breaths make it hurt more. She denies being short of breath. She denies abdominal pain but does endorse left flank pain. The patient poor she has pain deep in her groin on the left. She reports when she fell her legs did kind of the splits movement, and she thinks she probably ripped her groin muscle. She reports at the restaurant it was too painful for her to be able to get up and stand. She was assisted into a rolling chair and transferred to a private vehicle by what she arrived to the emergency department. As a baseline this is a very healthy active 80 year old female.  Past Medical History  Diagnosis Date  . Orthostatic syncope   . Atrioventricular nodal re-entry tachycardia     s/p ablation in [place]  . Hypothyroidism   . Mycobacterium avium-intracellulare infection   . HX: breast cancer   . HTN (hypertension)   . Migraine headache   . Vertigo   . Anemia   . Hyponatremia   . History of hepatitis   . Recurrent UTI   . DJD (degenerative joint disease)   . Anxiety   . LBBB (left bundle branch block)   . Cirrhosis    Past Surgical History  Procedure  Laterality Date  . Cholecystectomy    . Carpal tunnel release    . Hemorrhoid surgery    . Breast lumpectomy    . Tubal ligation    . Tonsillectomy    . Bunionectomy    . Slow pathway modification      AVNRT    Family History  Problem Relation Age of Onset  . Coronary artery disease    . Stroke    . Asthma     History  Substance Use Topics  . Smoking status: Never Smoker   . Smokeless tobacco: Not on file  . Alcohol Use: No   OB History   Grav Para Term Preterm Abortions TAB SAB Ect Mult Living                 Review of Systems 10 Systems reviewed and are negative for acute change except as noted in the HPI.    Allergies  Penicillins; Red dye; and Sulfonamide derivatives  Home Medications   Prior to Admission medications   Medication Sig Start Date End Date Taking? Authorizing Provider  spironolactone (ALDACTONE) 50 MG tablet Take 50 mg by mouth daily.   Yes Historical Provider, MD  Calcium Carbonate-Vitamin D (CALCIUM 600/VITAMIN D) 600-400 MG-UNIT per tablet Take 1 tablet by mouth daily.      Historical Provider, MD  fexofenadine (ALLEGRA) 180  MG tablet Take 180 mg by mouth as needed.     Historical Provider, MD  furosemide (LASIX) 20 MG tablet Take 1 tablet by mouth daily. 08/26/13   Historical Provider, MD  levothyroxine (SYNTHROID, LEVOTHROID) 50 MCG tablet Take 50 mcg by mouth daily.      Historical Provider, MD  propranolol (INDERAL) 10 MG tablet Take 1 tablet by mouth daily.  07/07/13   Historical Provider, MD  valsartan (DIOVAN) 40 MG tablet Take 40 mg by mouth daily.    Historical Provider, MD  zolpidem (AMBIEN) 10 MG tablet Take 5 mg by mouth at bedtime as needed.     Historical Provider, MD   BP 139/63  Pulse 66  Temp(Src) 97.8 F (36.6 C) (Oral)  Resp 18  Wt 100 lb (45.36 kg)  SpO2 96% Physical Exam Constitutional: This is a well-nourished well-developed 80 year old female appears younger than stated age. She is alert with normal cognitive function.  She is only expressing mild pain. Head face: Normocephalic atraumatic Eyes: Pupils equally round reactive to light, extraocular motions intact Nose: No deformity or bleeding Oral cavity: Makes membranes pink and moist, airway intact Neck: Patient denies C-spine tenderness Lungs: Symmetric air flow, mild basilar rale. No respiratory distress. Chest wall: Patient is very tender to palpation on the left lateral and posterior chest wall from about the fifth or sixth rib to the 11th and 12th rib. At this point I don't see any abrasions or hematoma. Abdomen: Nontender, patient is not endorsing tenderness in the left upper quadrant to splenic palpation Back: At this point there is no visible abrasion or hematoma. Extremities: Patient has full range of motion of both the upper extremities and the lower extremities. She is able to spontaneously flex and extend the hip. She does illustrate me with the hip in flexion and external rotation that that causes a significant amount of pain into her groin. Neurologic: Alert and oriented x3. Speech is clear. Was according purposeful and symmetric with full spontaneous use of both the upper and lower extremities.  ED Course  Procedures (including critical care time) Labs Review Labs Reviewed  COMPREHENSIVE METABOLIC PANEL - Abnormal; Notable for the following:    Sodium 131 (*)    Chloride 94 (*)    BUN 35 (*)    Creatinine, Ser 1.60 (*)    Total Protein 8.7 (*)    Albumin 3.1 (*)    AST 48 (*)    GFR calc non Af Amer 28 (*)    GFR calc Af Amer 33 (*)    All other components within normal limits  CBC WITH DIFFERENTIAL - Abnormal; Notable for the following:    WBC 3.3 (*)    RDW 15.7 (*)    Platelets 62 (*)    Lymphs Abs 0.6 (*)    All other components within normal limits  APTT - Abnormal; Notable for the following:    aPTT 42 (*)    All other components within normal limits  PROTIME-INR - Abnormal; Notable for the following:    Prothrombin Time  15.8 (*)    All other components within normal limits    Imaging Review Ct Abdomen Pelvis Wo Contrast  02/26/2014   CLINICAL DATA:  Patient fell onto a tile floor striking the left side over body. Pain in the left leg, hip, knee, flank, back, head, and face. No contrast given due to renal insufficiency.  EXAM: CT CHEST, ABDOMEN AND PELVIS WITHOUT CONTRAST  TECHNIQUE: Multidetector CT imaging of  the chest, abdomen and pelvis was performed following the standard protocol without IV contrast.  COMPARISON:  CT abdomen and pelvis 06/15/2013.  CT chest 12/03/2012.  FINDINGS: CT CHEST FINDINGS  Borderline heart size. Coronary artery calcification. Normal caliber thoracic aorta with calcification. Esophagus is decompressed. Calcified granulomas in the left lung with calcified left hilar lymph nodes. No significant mediastinal lymphadenopathy. Esophagus is decompressed. Reticular nodular infiltrates demonstrated in both lungs with patchy distribution. These may represent postinflammatory changes due to chronic infectious process. Bronchial wall thickening without significant bronchiectasis. No change since prior study. No airspace disease or focal consolidation. No pneumothorax. No pleural effusions.  CT ABDOMEN AND PELVIS FINDINGS  Changes of hepatic cirrhosis with diffuse nodular contour to the liver and heterogeneous nodular internal architecture. Splenic enlargement. Upper abdominal varices. Surgical absence of the gallbladder. Calcification of the aorta without aneurysm. The unenhanced appearance of the pancreas, adrenal glands, kidneys, inferior vena cava, and retroperitoneal lymph nodes is unremarkable. Small amount of free fluid in the abdomen and pelvis consistent with ascites. Stomach and small bowel are decompressed. Stool fills the colon without distention. No free air in the abdomen. No findings to suggest abdominal or retroperitoneal hematoma.  Pelvis: Bladder wall is not thickened. No mass or  lymphadenopathy in the pelvis. No inflammatory changes in the right lower quadrant or sigmoid colon.  Bones: Normal alignment of the thoracic and lumbar spine. Compression fractures demonstrated at L1 and L2 post kyphoplasty at L1. These changes appear chronic. Of the diffuse degenerative change throughout the thoracic and lumbar spine. Sternum appears intact. Acute fractures are demonstrated in the left 8, 9, 10 ribs. The pelvis, sacrum, fracture of the left superior and inferior pubic rami. Sacrum and hips appear intact.  IMPRESSION: Acute fractures of the left eighth, ninth, and tenth ribs as well as the superior and inferior pubic rami on the left. No findings to suggest internal organ injury or hematoma in the chest, abdomen, or pelvis on noncontrast imaging. Chronic postinflammatory changes in the lungs. Hepatic cirrhosis with evidence of portal venous hypertension including multiple abdominal varices and splenic enlargement. Mild abdominal and pelvic ascites.   Electronically Signed   By: Lucienne Capers M.D.   On: 02/26/2014 23:25   Ct Chest Wo Contrast  02/26/2014   CLINICAL DATA:  Patient fell onto a tile floor striking the left side over body. Pain in the left leg, hip, knee, flank, back, head, and face. No contrast given due to renal insufficiency.  EXAM: CT CHEST, ABDOMEN AND PELVIS WITHOUT CONTRAST  TECHNIQUE: Multidetector CT imaging of the chest, abdomen and pelvis was performed following the standard protocol without IV contrast.  COMPARISON:  CT abdomen and pelvis 06/15/2013.  CT chest 12/03/2012.  FINDINGS: CT CHEST FINDINGS  Borderline heart size. Coronary artery calcification. Normal caliber thoracic aorta with calcification. Esophagus is decompressed. Calcified granulomas in the left lung with calcified left hilar lymph nodes. No significant mediastinal lymphadenopathy. Esophagus is decompressed. Reticular nodular infiltrates demonstrated in both lungs with patchy distribution. These may  represent postinflammatory changes due to chronic infectious process. Bronchial wall thickening without significant bronchiectasis. No change since prior study. No airspace disease or focal consolidation. No pneumothorax. No pleural effusions.  CT ABDOMEN AND PELVIS FINDINGS  Changes of hepatic cirrhosis with diffuse nodular contour to the liver and heterogeneous nodular internal architecture. Splenic enlargement. Upper abdominal varices. Surgical absence of the gallbladder. Calcification of the aorta without aneurysm. The unenhanced appearance of the pancreas, adrenal glands, kidneys, inferior vena cava,  and retroperitoneal lymph nodes is unremarkable. Small amount of free fluid in the abdomen and pelvis consistent with ascites. Stomach and small bowel are decompressed. Stool fills the colon without distention. No free air in the abdomen. No findings to suggest abdominal or retroperitoneal hematoma.  Pelvis: Bladder wall is not thickened. No mass or lymphadenopathy in the pelvis. No inflammatory changes in the right lower quadrant or sigmoid colon.  Bones: Normal alignment of the thoracic and lumbar spine. Compression fractures demonstrated at L1 and L2 post kyphoplasty at L1. These changes appear chronic. Of the diffuse degenerative change throughout the thoracic and lumbar spine. Sternum appears intact. Acute fractures are demonstrated in the left 8, 9, 10 ribs. The pelvis, sacrum, fracture of the left superior and inferior pubic rami. Sacrum and hips appear intact.  IMPRESSION: Acute fractures of the left eighth, ninth, and tenth ribs as well as the superior and inferior pubic rami on the left. No findings to suggest internal organ injury or hematoma in the chest, abdomen, or pelvis on noncontrast imaging. Chronic postinflammatory changes in the lungs. Hepatic cirrhosis with evidence of portal venous hypertension including multiple abdominal varices and splenic enlargement. Mild abdominal and pelvic ascites.    Electronically Signed   By: Lucienne Capers M.D.   On: 02/26/2014 23:25     EKG Interpretation None     The patient's case has been consult with Dr. Grandville Silos. She we [facility] for further assessment and monitoring. The patient has refused pain medications stating concern for adverse reaction. At recheck, her condition is unchanged. She remains alert appropriate in no respiratory distress. CRITICAL CARE Performed by: Charlesetta Shanks   Total critical care time: 30  Critical care time was exclusive of separately billable procedures and treating other patients.  Critical care was necessary to treat or prevent imminent or life-threatening deterioration.  Critical care was time spent personally by me on the following activities: development of treatment plan with patient and/or surrogate as well as nursing, discussions with consultants, evaluation of patient's response to treatment, examination of patient, obtaining history from patient or surrogate, ordering and performing treatments and interventions, ordering and review of laboratory studies, ordering and review of radiographic studies, pulse oximetry and re-evaluation of patient's condition. MDM   Final diagnoses:  Fracture of multiple ribs of left side, closed, initial encounter  Pelvis fracture, closed, initial encounter  Thrombocytopenia   The patient has taken a fall which has resulted in multiple rib fractures and pelvic fracture. Her initial hemoglobin is a 14 and her vital signs are stable at this point in time. I do feel that she needs further evaluation and observation in the hospital. With the patient's age, thrombocytopenia, and inability to do IV contrast CT (per radiology) due to her renal insufficiency, I feel she will need hospital monitoring and serial CBCs to observe for any delayed bleeding effect as well as ultimately getting pain control and assessing mobility.    Charlesetta Shanks, MD 02/27/14 [phone]

## 2014-02-26 NOTE — ED Notes (Signed)
I placed a Fall risk band and yellow socks due to Pts family member stating she can not stand or walk by herself.

## 2014-02-26 NOTE — ED Notes (Signed)
Contrast CT changed to non contrast d/t elevated creatinine 1.6

## 2014-02-27 ENCOUNTER — Emergency Department (HOSPITAL_BASED_OUTPATIENT_CLINIC_OR_DEPARTMENT_OTHER): Payer: No Typology Code available for payment source

## 2014-02-27 ENCOUNTER — Encounter (HOSPITAL_COMMUNITY): Payer: Self-pay | Admitting: *Deleted

## 2014-02-27 DIAGNOSIS — D62 Acute posthemorrhagic anemia: Secondary | ICD-10-CM | POA: Diagnosis present

## 2014-02-27 DIAGNOSIS — S2249XA Multiple fractures of ribs, unspecified side, initial encounter for closed fracture: Secondary | ICD-10-CM | POA: Diagnosis present

## 2014-02-27 DIAGNOSIS — D696 Thrombocytopenia, unspecified: Secondary | ICD-10-CM | POA: Diagnosis present

## 2014-02-27 DIAGNOSIS — Z825 Family history of asthma and other chronic lower respiratory diseases: Secondary | ICD-10-CM | POA: Diagnosis not present

## 2014-02-27 DIAGNOSIS — Z8249 Family history of ischemic heart disease and other diseases of the circulatory system: Secondary | ICD-10-CM | POA: Diagnosis not present

## 2014-02-27 DIAGNOSIS — Z853 Personal history of malignant neoplasm of breast: Secondary | ICD-10-CM | POA: Diagnosis not present

## 2014-02-27 DIAGNOSIS — K7460 Unspecified cirrhosis of liver: Secondary | ICD-10-CM | POA: Diagnosis present

## 2014-02-27 DIAGNOSIS — I10 Essential (primary) hypertension: Secondary | ICD-10-CM | POA: Diagnosis present

## 2014-02-27 DIAGNOSIS — Z23 Encounter for immunization: Secondary | ICD-10-CM | POA: Diagnosis not present

## 2014-02-27 DIAGNOSIS — Z882 Allergy status to sulfonamides status: Secondary | ICD-10-CM | POA: Diagnosis not present

## 2014-02-27 DIAGNOSIS — M899 Disorder of bone, unspecified: Secondary | ICD-10-CM | POA: Diagnosis present

## 2014-02-27 DIAGNOSIS — Z681 Body mass index (BMI) 19 or less, adult: Secondary | ICD-10-CM | POA: Diagnosis not present

## 2014-02-27 DIAGNOSIS — S2239XA Fracture of one rib, unspecified side, initial encounter for closed fracture: Secondary | ICD-10-CM | POA: Diagnosis present

## 2014-02-27 DIAGNOSIS — M810 Age-related osteoporosis without current pathological fracture: Secondary | ICD-10-CM | POA: Diagnosis present

## 2014-02-27 DIAGNOSIS — M949 Disorder of cartilage, unspecified: Secondary | ICD-10-CM | POA: Diagnosis present

## 2014-02-27 DIAGNOSIS — W010XXA Fall on same level from slipping, tripping and stumbling without subsequent striking against object, initial encounter: Secondary | ICD-10-CM | POA: Diagnosis present

## 2014-02-27 DIAGNOSIS — Z91018 Allergy to other foods: Secondary | ICD-10-CM | POA: Diagnosis not present

## 2014-02-27 DIAGNOSIS — Z823 Family history of stroke: Secondary | ICD-10-CM | POA: Diagnosis not present

## 2014-02-27 DIAGNOSIS — Z888 Allergy status to other drugs, medicaments and biological substances status: Secondary | ICD-10-CM | POA: Diagnosis not present

## 2014-02-27 DIAGNOSIS — R634 Abnormal weight loss: Secondary | ICD-10-CM | POA: Diagnosis present

## 2014-02-27 DIAGNOSIS — M1990 Unspecified osteoarthritis, unspecified site: Secondary | ICD-10-CM | POA: Diagnosis present

## 2014-02-27 DIAGNOSIS — E039 Hypothyroidism, unspecified: Secondary | ICD-10-CM | POA: Diagnosis present

## 2014-02-27 DIAGNOSIS — F411 Generalized anxiety disorder: Secondary | ICD-10-CM | POA: Diagnosis present

## 2014-02-27 DIAGNOSIS — S32509A Unspecified fracture of unspecified pubis, initial encounter for closed fracture: Secondary | ICD-10-CM | POA: Diagnosis present

## 2014-02-27 DIAGNOSIS — R079 Chest pain, unspecified: Secondary | ICD-10-CM | POA: Diagnosis present

## 2014-02-27 DIAGNOSIS — Y9229 Other specified public building as the place of occurrence of the external cause: Secondary | ICD-10-CM | POA: Diagnosis not present

## 2014-02-27 DIAGNOSIS — Z880 Allergy status to penicillin: Secondary | ICD-10-CM | POA: Diagnosis not present

## 2014-02-27 DIAGNOSIS — E43 Unspecified severe protein-calorie malnutrition: Secondary | ICD-10-CM | POA: Diagnosis present

## 2014-02-27 LAB — PROTIME-INR
INR: 1.26 (ref 0.00–1.49)
Prothrombin Time: 15.8 s — ABNORMAL HIGH (ref 11.6–15.2)

## 2014-02-27 LAB — TYPE AND SCREEN
ABO/RH(D): A POS
ANTIBODY SCREEN: NEGATIVE

## 2014-02-27 LAB — CBC
HCT: 25.9 % — ABNORMAL LOW (ref 36.0–46.0)
HEMATOCRIT: 28.8 % — AB (ref 36.0–46.0)
HEMOGLOBIN: 8.6 g/dL — AB (ref 12.0–15.0)
Hemoglobin: 9.4 g/dL — ABNORMAL LOW (ref 12.0–15.0)
MCH: 29.4 pg (ref 26.0–34.0)
MCH: 29.1 pg (ref 26.0–34.0)
MCHC: 33.2 g/dL (ref 30.0–36.0)
MCHC: 32.6 g/dL (ref 30.0–36.0)
MCV: 88.4 fL (ref 78.0–100.0)
MCV: 89.2 fL (ref 78.0–100.0)
PLATELETS: 95 10*3/uL — AB (ref 150–400)
Platelets: 87 10*3/uL — ABNORMAL LOW (ref 150–400)
RBC: 2.93 MIL/uL — ABNORMAL LOW (ref 3.87–5.11)
RBC: 3.23 MIL/uL — AB (ref 3.87–5.11)
RDW: 15.1 % (ref 11.5–15.5)
RDW: 15.3 % (ref 11.5–15.5)
WBC: 3 10*3/uL — AB (ref 4.0–10.5)
WBC: 3.4 10*3/uL — AB (ref 4.0–10.5)

## 2014-02-27 LAB — CREATININE, SERUM
CREATININE: 1.24 mg/dL — AB (ref 0.50–1.10)
GFR calc Af Amer: 45 mL/min — ABNORMAL LOW (ref >90)
GFR calc non Af Amer: 38 mL/min — ABNORMAL LOW (ref >90)

## 2014-02-27 LAB — APTT: aPTT: 42 s — ABNORMAL HIGH (ref 24–37)

## 2014-02-27 LAB — ABO/RH: ABO/RH(D): A POS

## 2014-02-27 MED ORDER — PROPRANOLOL HCL 10 MG PO TABS
10.0000 mg | ORAL_TABLET | Freq: Every day | ORAL | Status: DC
  Administered 2014-02-27 – 2014-03-02 (×3): 10 mg via ORAL
  Filled 2014-02-27 (×4): qty 1

## 2014-02-27 MED ORDER — LEVOTHYROXINE SODIUM 50 MCG PO TABS
50.0000 ug | ORAL_TABLET | Freq: Every day | ORAL | Status: DC
  Administered 2014-02-27 – 2014-03-02 (×4): 50 ug via ORAL
  Filled 2014-02-27 (×5): qty 1

## 2014-02-27 MED ORDER — ONDANSETRON HCL 4 MG/2ML IJ SOLN: 4.0000 mg | Freq: Four times a day (QID) | INTRAMUSCULAR | Status: DC | PRN

## 2014-02-27 MED ORDER — SODIUM CHLORIDE 0.9 % IV SOLN: INTRAVENOUS | Status: AC

## 2014-02-27 MED ORDER — PANTOPRAZOLE SODIUM 40 MG IV SOLR
40.0000 mg | Freq: Every day | INTRAVENOUS | Status: DC
Start: 2014-02-27 — End: 2014-03-01
  Administered 2014-02-27: 40 mg via INTRAVENOUS
  Filled 2014-02-27 (×3): qty 40

## 2014-02-27 MED ORDER — MORPHINE SULFATE 2 MG/ML IJ SOLN: 2.0000 mg | INTRAMUSCULAR | Status: DC | PRN

## 2014-02-27 MED ORDER — SPIRONOLACTONE 50 MG PO TABS
50.0000 mg | ORAL_TABLET | Freq: Every day | ORAL | Status: DC
  Administered 2014-02-27 – 2014-03-02 (×4): 50 mg via ORAL
  Filled 2014-02-27 (×4): qty 1

## 2014-02-27 MED ORDER — INFLUENZA VAC SPLIT QUAD 0.5 ML IM SUSY
0.5000 mL | PREFILLED_SYRINGE | INTRAMUSCULAR | Status: AC
  Administered 2014-02-28: 0.5 mL via INTRAMUSCULAR
  Filled 2014-02-27: qty 0.5

## 2014-02-27 MED ORDER — ONDANSETRON HCL 4 MG PO TABS: 4.0000 mg | ORAL_TABLET | Freq: Four times a day (QID) | ORAL | Status: DC | PRN

## 2014-02-27 MED ORDER — KCL IN DEXTROSE-NACL 20-5-0.45 MEQ/L-%-% IV SOLN
INTRAVENOUS | Status: DC
  Administered 2014-02-27: 09:00:00 via INTRAVENOUS
  Filled 2014-02-27 (×4): qty 1000

## 2014-02-27 MED ORDER — ENOXAPARIN SODIUM 30 MG/0.3ML ~~LOC~~ SOLN
30.0000 mg | SUBCUTANEOUS | Status: DC
  Administered 2014-02-27 – 2014-03-02 (×4): 30 mg via SUBCUTANEOUS
  Filled 2014-02-27 (×4): qty 0.3

## 2014-02-27 MED ORDER — ZOLPIDEM TARTRATE 5 MG PO TABS: 5.0000 mg | ORAL_TABLET | Freq: Every evening | ORAL | Status: DC | PRN

## 2014-02-27 MED ORDER — FUROSEMIDE 20 MG PO TABS
20.0000 mg | ORAL_TABLET | Freq: Every day | ORAL | Status: DC
  Administered 2014-02-27 – 2014-03-02 (×4): 20 mg via ORAL
  Filled 2014-02-27 (×4): qty 1

## 2014-02-27 MED ORDER — TRAMADOL HCL 50 MG PO TABS
50.0000 mg | ORAL_TABLET | Freq: Four times a day (QID) | ORAL | Status: DC | PRN
  Administered 2014-02-27 – 2014-02-28 (×4): 50 mg via ORAL
  Filled 2014-02-27 (×4): qty 1

## 2014-02-27 MED ORDER — PANTOPRAZOLE SODIUM 40 MG PO TBEC
40.0000 mg | DELAYED_RELEASE_TABLET | Freq: Every day | ORAL | Status: DC
  Administered 2014-02-28: 40 mg via ORAL
  Filled 2014-02-27: qty 1

## 2014-02-27 NOTE — H&P (Signed)
Miranda Blackburn is an 80 y.o. female.   Chief Complaint: L rib pain HPI: Miranda Blackburn went out to a [employer] last night. It was raining and the floors and signed were wet. Prior to eating she slipped and fell onto her left side. No loss of consciousness. She was evaluated and [facility]. She was found to have multiple left rib fractures and left pubic rami fractures.I accepted her in transfer for admission to the trauma service. She complains of left rib pain. Otherwise she is hungry.  Past Medical History  Diagnosis Date  . Orthostatic syncope   . Atrioventricular nodal re-entry tachycardia     s/p ablation in [place]  . Hypothyroidism   . Mycobacterium avium-intracellulare infection   . HX: breast cancer   . HTN (hypertension)   . Migraine headache   . Vertigo   . Anemia   . Hyponatremia   . History of hepatitis   . Recurrent UTI   . DJD (degenerative joint disease)   . Anxiety   . LBBB (left bundle branch block)   . Cirrhosis     Past Surgical History  Procedure Laterality Date  . Cholecystectomy    . Carpal tunnel release    . Hemorrhoid surgery    . Breast lumpectomy    . Tubal ligation    . Tonsillectomy    . Bunionectomy    . Slow pathway modification      AVNRT     Family History  Problem Relation Age of Onset  . Coronary artery disease    . Stroke    . Asthma     Social History:  reports that she has never smoked. She does not have any smokeless tobacco history on file. She reports that she does not drink alcohol or use illicit drugs.  Allergies:  Allergies  Allergen Reactions  . Penicillins   . Red Dye     vomiting  . Sulfonamide Derivatives     Medications Prior to Admission  Medication Sig Dispense Refill  . spironolactone (ALDACTONE) 50 MG tablet Take 50 mg by mouth daily.      . Calcium Carbonate-Vitamin D (CALCIUM 600/VITAMIN D) 600-400 MG-UNIT per tablet Take 1 tablet by mouth daily.        . fexofenadine  (ALLEGRA) 180 MG tablet Take 180 mg by mouth as needed.       . furosemide (LASIX) 20 MG tablet Take 1 tablet by mouth daily.      Marland Kitchen levothyroxine (SYNTHROID, LEVOTHROID) 50 MCG tablet Take 50 mcg by mouth daily.        . propranolol (INDERAL) 10 MG tablet Take 1 tablet by mouth daily.       . valsartan (DIOVAN) 40 MG tablet Take 40 mg by mouth daily.      Marland Kitchen zolpidem (AMBIEN) 10 MG tablet Take 5 mg by mouth at bedtime as needed.         Results for orders placed during the hospital encounter of 02/26/14 (from the past 48 hour(s))  COMPREHENSIVE METABOLIC PANEL     Status: Abnormal   Collection Time    02/26/14  9:55 PM      Result Value Ref Range   Sodium 131 (*) 137 - 147 mEq/L   Potassium 4.4  3.7 - 5.3 mEq/L   Chloride 94 (*) 96 - 112 mEq/L   CO2 25  19 - 32 mEq/L   Glucose, Bld 90  70 - 99 mg/dL  BUN 35 (*) 6 - 23 mg/dL   Creatinine, Ser 1.60 (*) 0.50 - 1.10 mg/dL   Calcium 9.0  8.4 - 10.5 mg/dL   Total Protein 8.7 (*) 6.0 - 8.3 g/dL   Albumin 3.1 (*) 3.5 - 5.2 g/dL   AST 48 (*) 0 - 37 U/L   ALT 25  0 - 35 U/L   Alkaline Phosphatase 101  39 - 117 U/L   Total Bilirubin 0.7  0.3 - 1.2 mg/dL   GFR calc non Af Amer 28 (*) >90 mL/min   GFR calc Af Amer 33 (*) >90 mL/min   Comment: (NOTE)     The eGFR has been calculated using the CKD EPI equation.     This calculation has not been validated in all clinical situations.     eGFR's persistently <90 mL/min signify possible Chronic Kidney     Disease.   Anion gap 12  5 - 15  CBC WITH DIFFERENTIAL     Status: Abnormal   Collection Time    02/26/14  9:55 PM      Result Value Ref Range   WBC 3.3 (*) 4.0 - 10.5 K/uL   RBC 4.83  3.87 - 5.11 MIL/uL   Hemoglobin 14.4  12.0 - 15.0 g/dL   HCT 42.1  36.0 - 46.0 %   MCV 87.2  78.0 - 100.0 fL   MCH 29.8  26.0 - 34.0 pg   MCHC 34.2  30.0 - 36.0 g/dL   RDW 15.7 (*) 11.5 - 15.5 %   Platelets 62 (*) 150 - 400 K/uL   Comment: REPEATED TO VERIFY     SPECIMEN CHECKED FOR CLOTS   Neutrophils  Relative % 71  43 - 77 %   Neutro Abs 2.3  1.7 - 7.7 K/uL   Lymphocytes Relative 17  12 - 46 %   Lymphs Abs 0.6 (*) 0.7 - 4.0 K/uL   Monocytes Relative 11  3 - 12 %   Monocytes Absolute 0.4  0.1 - 1.0 K/uL   Eosinophils Relative 1  0 - 5 %   Eosinophils Absolute 0.0  0.0 - 0.7 K/uL   Basophils Relative 0  0 - 1 %   Basophils Absolute 0.0  0.0 - 0.1 K/uL   WBC Morphology WHITE COUNT CONFIRMED ON SMEAR     Smear Review PLATELET COUNT CONFIRMED BY SMEAR    APTT     Status: Abnormal   Collection Time    02/26/14  9:55 PM      Result Value Ref Range   aPTT 42 (*) 24 - 37 seconds   Comment:            IF BASELINE aPTT IS ELEVATED,     SUGGEST PATIENT RISK ASSESSMENT     BE USED TO DETERMINE APPROPRIATE     ANTICOAGULANT THERAPY.  PROTIME-INR     Status: Abnormal   Collection Time    02/26/14  9:55 PM      Result Value Ref Range   Prothrombin Time 15.8 (*) 11.6 - 15.2 seconds   INR 1.26  0.00 - 1.49   Ct Abdomen Pelvis Wo Contrast  02/26/2014   CLINICAL DATA:  Patient fell onto a tile floor striking the left side over body. Pain in the left leg, hip, knee, flank, back, head, and face. No contrast given due to renal insufficiency.  EXAM: CT CHEST, ABDOMEN AND PELVIS WITHOUT CONTRAST  TECHNIQUE: Multidetector CT imaging of the chest, abdomen and  pelvis was performed following the standard protocol without IV contrast.  COMPARISON:  CT abdomen and pelvis 06/15/2013.  CT chest 12/03/2012.  FINDINGS: CT CHEST FINDINGS  Borderline heart size. Coronary artery calcification. Normal caliber thoracic aorta with calcification. Esophagus is decompressed. Calcified granulomas in the left lung with calcified left hilar lymph nodes. No significant mediastinal lymphadenopathy. Esophagus is decompressed. Reticular nodular infiltrates demonstrated in both lungs with patchy distribution. These may represent postinflammatory changes due to chronic infectious process. Bronchial wall thickening without significant  bronchiectasis. No change since prior study. No airspace disease or focal consolidation. No pneumothorax. No pleural effusions.  CT ABDOMEN AND PELVIS FINDINGS  Changes of hepatic cirrhosis with diffuse nodular contour to the liver and heterogeneous nodular internal architecture. Splenic enlargement. Upper abdominal varices. Surgical absence of the gallbladder. Calcification of the aorta without aneurysm. The unenhanced appearance of the pancreas, adrenal glands, kidneys, inferior vena cava, and retroperitoneal lymph nodes is unremarkable. Small amount of free fluid in the abdomen and pelvis consistent with ascites. Stomach and small bowel are decompressed. Stool fills the colon without distention. No free air in the abdomen. No findings to suggest abdominal or retroperitoneal hematoma.  Pelvis: Bladder wall is not thickened. No mass or lymphadenopathy in the pelvis. No inflammatory changes in the right lower quadrant or sigmoid colon.  Bones: Normal alignment of the thoracic and lumbar spine. Compression fractures demonstrated at L1 and L2 post kyphoplasty at L1. These changes appear chronic. Of the diffuse degenerative change throughout the thoracic and lumbar spine. Sternum appears intact. Acute fractures are demonstrated in the left 8, 9, 10 ribs. The pelvis, sacrum, fracture of the left superior and inferior pubic rami. Sacrum and hips appear intact.  IMPRESSION: Acute fractures of the left eighth, ninth, and tenth ribs as well as the superior and inferior pubic rami on the left. No findings to suggest internal organ injury or hematoma in the chest, abdomen, or pelvis on noncontrast imaging. Chronic postinflammatory changes in the lungs. Hepatic cirrhosis with evidence of portal venous hypertension including multiple abdominal varices and splenic enlargement. Mild abdominal and pelvic ascites.   Electronically Signed   By: Lucienne Capers M.D.   On: 02/26/2014 23:25   Ct Head Wo Contrast  02/27/2014    CLINICAL DATA:  Fall, striking left side of body. Pain in the left leg, hip, knee, flank, back, head, and face.  EXAM: CT HEAD WITHOUT CONTRAST  CT CERVICAL SPINE WITHOUT CONTRAST  TECHNIQUE: Multidetector CT imaging of the head and cervical spine was performed following the standard protocol without intravenous contrast. Multiplanar CT image reconstructions of the cervical spine were also generated.  COMPARISON:  None.  FINDINGS: CT HEAD FINDINGS  Technically limited due to oblique patient positioning and motion artifact. Mild diffuse cerebral atrophy. No ventricular dilatation. Low-attenuation changes in the deep white matter consistent with small vessel ischemia. No mass effect or midline shift. No abnormal extra-axial fluid collections. Gray-white matter junctions are distinct. Basal cisterns are not effaced. No evidence of acute intracranial hemorrhage. No depressed skull fractures. Visualized paranasal sinuses and mastoid air cells are not opacified.  CT CERVICAL SPINE FINDINGS  There is straightening of the usual cervical lordosis which may be due to patient positioning but ligamentous injury or muscle spasm could also have this appearance. No anterior subluxation. Diffuse degenerative change throughout the cervical spine with narrowed cervical interspaces and associated endplate hypertrophic changes. Degenerative changes in the facet joints. No vertebral compression deformities. No prevertebral soft tissue swelling. C1-2 articulation  appears intact. With no focal bone lesion or bone destruction. Bone cortex and trabecular architecture appear intact. Vascular calcifications in the cervical carotid arteries.  IMPRESSION: No acute intracranial abnormalities. Degenerative changes in the cervical spine. Nonspecific straightening of the usual cervical lordosis. No displaced fractures identified.   Electronically Signed   By: Lucienne Capers M.D.   On: 02/27/2014 00:52   Ct Chest Wo Contrast  02/26/2014    CLINICAL DATA:  Patient fell onto a tile floor striking the left side over body. Pain in the left leg, hip, knee, flank, back, head, and face. No contrast given due to renal insufficiency.  EXAM: CT CHEST, ABDOMEN AND PELVIS WITHOUT CONTRAST  TECHNIQUE: Multidetector CT imaging of the chest, abdomen and pelvis was performed following the standard protocol without IV contrast.  COMPARISON:  CT abdomen and pelvis 06/15/2013.  CT chest 12/03/2012.  FINDINGS: CT CHEST FINDINGS  Borderline heart size. Coronary artery calcification. Normal caliber thoracic aorta with calcification. Esophagus is decompressed. Calcified granulomas in the left lung with calcified left hilar lymph nodes. No significant mediastinal lymphadenopathy. Esophagus is decompressed. Reticular nodular infiltrates demonstrated in both lungs with patchy distribution. These may represent postinflammatory changes due to chronic infectious process. Bronchial wall thickening without significant bronchiectasis. No change since prior study. No airspace disease or focal consolidation. No pneumothorax. No pleural effusions.  CT ABDOMEN AND PELVIS FINDINGS  Changes of hepatic cirrhosis with diffuse nodular contour to the liver and heterogeneous nodular internal architecture. Splenic enlargement. Upper abdominal varices. Surgical absence of the gallbladder. Calcification of the aorta without aneurysm. The unenhanced appearance of the pancreas, adrenal glands, kidneys, inferior vena cava, and retroperitoneal lymph nodes is unremarkable. Small amount of free fluid in the abdomen and pelvis consistent with ascites. Stomach and small bowel are decompressed. Stool fills the colon without distention. No free air in the abdomen. No findings to suggest abdominal or retroperitoneal hematoma.  Pelvis: Bladder wall is not thickened. No mass or lymphadenopathy in the pelvis. No inflammatory changes in the right lower quadrant or sigmoid colon.  Bones: Normal alignment of the  thoracic and lumbar spine. Compression fractures demonstrated at L1 and L2 post kyphoplasty at L1. These changes appear chronic. Of the diffuse degenerative change throughout the thoracic and lumbar spine. Sternum appears intact. Acute fractures are demonstrated in the left 8, 9, 10 ribs. The pelvis, sacrum, fracture of the left superior and inferior pubic rami. Sacrum and hips appear intact.  IMPRESSION: Acute fractures of the left eighth, ninth, and tenth ribs as well as the superior and inferior pubic rami on the left. No findings to suggest internal organ injury or hematoma in the chest, abdomen, or pelvis on noncontrast imaging. Chronic postinflammatory changes in the lungs. Hepatic cirrhosis with evidence of portal venous hypertension including multiple abdominal varices and splenic enlargement. Mild abdominal and pelvic ascites.   Electronically Signed   By: Lucienne Capers M.D.   On: 02/26/2014 23:25   Ct Cervical Spine Wo Contrast  02/27/2014   CLINICAL DATA:  Fall, striking left side of body. Pain in the left leg, hip, knee, flank, back, head, and face.  EXAM: CT HEAD WITHOUT CONTRAST  CT CERVICAL SPINE WITHOUT CONTRAST  TECHNIQUE: Multidetector CT imaging of the head and cervical spine was performed following the standard protocol without intravenous contrast. Multiplanar CT image reconstructions of the cervical spine were also generated.  COMPARISON:  None.  FINDINGS: CT HEAD FINDINGS  Technically limited due to oblique patient positioning and motion artifact. Mild  diffuse cerebral atrophy. No ventricular dilatation. Low-attenuation changes in the deep white matter consistent with small vessel ischemia. No mass effect or midline shift. No abnormal extra-axial fluid collections. Gray-white matter junctions are distinct. Basal cisterns are not effaced. No evidence of acute intracranial hemorrhage. No depressed skull fractures. Visualized paranasal sinuses and mastoid air cells are not opacified.  CT  CERVICAL SPINE FINDINGS  There is straightening of the usual cervical lordosis which may be due to patient positioning but ligamentous injury or muscle spasm could also have this appearance. No anterior subluxation. Diffuse degenerative change throughout the cervical spine with narrowed cervical interspaces and associated endplate hypertrophic changes. Degenerative changes in the facet joints. No vertebral compression deformities. No prevertebral soft tissue swelling. C1-2 articulation appears intact. With no focal bone lesion or bone destruction. Bone cortex and trabecular architecture appear intact. Vascular calcifications in the cervical carotid arteries.  IMPRESSION: No acute intracranial abnormalities. Degenerative changes in the cervical spine. Nonspecific straightening of the usual cervical lordosis. No displaced fractures identified.   Electronically Signed   By: Lucienne Capers M.D.   On: 02/27/2014 00:52    Review of Systems  Constitutional: Negative.   HENT: Negative.   Eyes: Negative.   Respiratory: Negative.   Cardiovascular: Positive for chest pain.  Gastrointestinal: Negative.   Genitourinary: Negative.   Musculoskeletal:       L pelvis pain  Skin: Negative.   Neurological: Negative.   Endo/Heme/Allergies: Negative.   Psychiatric/Behavioral: Negative.     Blood pressure 131/58, pulse 62, temperature 98 F (36.7 C), temperature source Oral, resp. rate 18, weight 100 lb (45.36 kg), SpO2 100.00%. Physical Exam  Constitutional: She is oriented to person, place, and time. She appears well-developed and well-nourished. No distress.  HENT:  Head: Normocephalic and atraumatic.  Nose: Nose normal.  Mouth/Throat: Oropharynx is clear and moist. No oropharyngeal exudate.  Eyes: Conjunctivae and EOM are normal. Pupils are equal, round, and reactive to light. Right eye exhibits no discharge. Left eye exhibits no discharge. No scleral icterus.  Neck: Normal range of motion. Neck supple.  No tracheal deviation present. No thyromegaly present.  Cardiovascular: Normal rate, normal heart sounds and intact distal pulses.   Respiratory: Effort normal and breath sounds normal. No stridor. No respiratory distress. She has no wheezes. She has no rales. She exhibits tenderness.  Left rib tenderness  GI: Soft. She exhibits no distension. There is no tenderness. There is no rebound and no guarding.  Musculoskeletal:  Left lower extremely movement somewhat limited due to pain left pelvis  Neurological: She is alert and oriented to person, place, and time. She has normal strength. She displays no tremor. She exhibits normal muscle tone. She displays no seizure activity. GCS eye subscore is 4. GCS verbal subscore is 5. GCS motor subscore is 6.  Skin: Skin is warm and dry.  Psychiatric: She has a normal mood and affect.     Assessment/Plan Fall Left rib fractures 8-10 Left superior and inferior pubic rami fractures  Admit, pulmonary toilet, pain control, PT OT.  Vita Currin E 02/27/2014, 5:05 AM

## 2014-02-27 NOTE — ED Notes (Signed)
Pts daughter place Pts belongs in Pt belongings bag.

## 2014-02-27 NOTE — Progress Notes (Signed)
I have seen and examined the pt and agree with PA-Jenning's progress note. PT Pain control IS

## 2014-02-27 NOTE — Progress Notes (Signed)
Subjective: Alert, sore both left chest and left thigh.  Has not taken pain medicine but I have encouraged her to try it.   She was treated for UTI in July and developed C diff colitis. She was in hospital in [place] and finished oral Vancomycin in Aug.  She is also losing weight.  See med list below.    Objective: Vital signs in last 24 hours: Temp:  [97.8 F (36.6 C)-98.2 F (36.8 C)] 98.2 F (36.8 C) (09/27 0750) Pulse Rate:  [57-70] 65 (09/27 0750) Resp:  [16-31] 18 (09/27 0750) BP: (91-162)/(45-63) 123/58 mmHg (09/27 0750) SpO2:  [89 %-100 %] 98 % (09/27 0750) Weight:  [45.36 kg (100 lb)] 45.36 kg (100 lb) (09/26 1746) Last BM Date: 02/26/14 Diet: regular Afebrile VSS Creatinine is better, Hbg  has dropped from 14.4 to 8.6 PTT 42/INR 1.26 Intake/Output from previous day:   Intake/Output this shift:    General appearance: alert, cooperative, no distress and hurts to move anything.  She is not taking pain meds. Resp: clear anterior, she won't take to deep of a breath because it hurts. Chest wall: no tenderness, left sided chest wall tenderness GI: soft, somewhat distended.  Few BS, she says it's been like this.  she is sore to palpation. Left lower trunk and thigh tender.  Lab Results:   Recent Labs  02/26/14 2155 02/27/14 0725  WBC 3.3* 3.0*  HGB 14.4 8.6*  HCT 42.1 25.9*  PLT 62* PENDING    BMET  Recent Labs  02/26/14 2155  NA 131*  K 4.4  CL 94*  CO2 25  GLUCOSE 90  BUN 35*  CREATININE 1.60*  CALCIUM 9.0   PT/INR  Recent Labs  02/26/14 2155  LABPROT 15.8*  INR 1.26     Recent Labs Lab 02/26/14 2155  AST 48*  ALT 25  ALKPHOS 101  BILITOT 0.7  PROT 8.7*  ALBUMIN 3.1*     Lipase  No results found for this basename: lipase     Studies/Results: Ct Abdomen Pelvis Wo Contrast  02/26/2014   CLINICAL DATA:  Patient fell onto a tile floor striking the left side over body. Pain in the left leg, hip, knee, flank, back, head, and face.  No contrast given due to renal insufficiency.  EXAM: CT CHEST, ABDOMEN AND PELVIS WITHOUT CONTRAST  TECHNIQUE: Multidetector CT imaging of the chest, abdomen and pelvis was performed following the standard protocol without IV contrast.  COMPARISON:  CT abdomen and pelvis 06/15/2013.  CT chest 12/03/2012.  FINDINGS: CT CHEST FINDINGS  Borderline heart size. Coronary artery calcification. Normal caliber thoracic aorta with calcification. Esophagus is decompressed. Calcified granulomas in the left lung with calcified left hilar lymph nodes. No significant mediastinal lymphadenopathy. Esophagus is decompressed. Reticular nodular infiltrates demonstrated in both lungs with patchy distribution. These may represent postinflammatory changes due to chronic infectious process. Bronchial wall thickening without significant bronchiectasis. No change since prior study. No airspace disease or focal consolidation. No pneumothorax. No pleural effusions.  CT ABDOMEN AND PELVIS FINDINGS  Changes of hepatic cirrhosis with diffuse nodular contour to the liver and heterogeneous nodular internal architecture. Splenic enlargement. Upper abdominal varices. Surgical absence of the gallbladder. Calcification of the aorta without aneurysm. The unenhanced appearance of the pancreas, adrenal glands, kidneys, inferior vena cava, and retroperitoneal lymph nodes is unremarkable. Small amount of free fluid in the abdomen and pelvis consistent with ascites. Stomach and small bowel are decompressed. Stool fills the colon without distention. No free air in  the abdomen. No findings to suggest abdominal or retroperitoneal hematoma.  Pelvis: Bladder wall is not thickened. No mass or lymphadenopathy in the pelvis. No inflammatory changes in the right lower quadrant or sigmoid colon.  Bones: Normal alignment of the thoracic and lumbar spine. Compression fractures demonstrated at L1 and L2 post kyphoplasty at L1. These changes appear chronic. Of the diffuse  degenerative change throughout the thoracic and lumbar spine. Sternum appears intact. Acute fractures are demonstrated in the left 8, 9, 10 ribs. The pelvis, sacrum, fracture of the left superior and inferior pubic rami. Sacrum and hips appear intact.  IMPRESSION: Acute fractures of the left eighth, ninth, and tenth ribs as well as the superior and inferior pubic rami on the left. No findings to suggest internal organ injury or hematoma in the chest, abdomen, or pelvis on noncontrast imaging. Chronic postinflammatory changes in the lungs. Hepatic cirrhosis with evidence of portal venous hypertension including multiple abdominal varices and splenic enlargement. Mild abdominal and pelvic ascites.   Electronically Signed   By: Lucienne Capers M.D.   On: 02/26/2014 23:25   Ct Head Wo Contrast  02/27/2014   CLINICAL DATA:  Fall, striking left side of body. Pain in the left leg, hip, knee, flank, back, head, and face.  EXAM: CT HEAD WITHOUT CONTRAST  CT CERVICAL SPINE WITHOUT CONTRAST  TECHNIQUE: Multidetector CT imaging of the head and cervical spine was performed following the standard protocol without intravenous contrast. Multiplanar CT image reconstructions of the cervical spine were also generated.  COMPARISON:  None.  FINDINGS: CT HEAD FINDINGS  Technically limited due to oblique patient positioning and motion artifact. Mild diffuse cerebral atrophy. No ventricular dilatation. Low-attenuation changes in the deep white matter consistent with small vessel ischemia. No mass effect or midline shift. No abnormal extra-axial fluid collections. Gray-white matter junctions are distinct. Basal cisterns are not effaced. No evidence of acute intracranial hemorrhage. No depressed skull fractures. Visualized paranasal sinuses and mastoid air cells are not opacified.  CT CERVICAL SPINE FINDINGS  There is straightening of the usual cervical lordosis which may be due to patient positioning but ligamentous injury or muscle  spasm could also have this appearance. No anterior subluxation. Diffuse degenerative change throughout the cervical spine with narrowed cervical interspaces and associated endplate hypertrophic changes. Degenerative changes in the facet joints. No vertebral compression deformities. No prevertebral soft tissue swelling. C1-2 articulation appears intact. With no focal bone lesion or bone destruction. Bone cortex and trabecular architecture appear intact. Vascular calcifications in the cervical carotid arteries.  IMPRESSION: No acute intracranial abnormalities. Degenerative changes in the cervical spine. Nonspecific straightening of the usual cervical lordosis. No displaced fractures identified.   Electronically Signed   By: Lucienne Capers M.D.   On: 02/27/2014 00:52   Ct Chest Wo Contrast  02/26/2014   CLINICAL DATA:  Patient fell onto a tile floor striking the left side over body. Pain in the left leg, hip, knee, flank, back, head, and face. No contrast given due to renal insufficiency.  EXAM: CT CHEST, ABDOMEN AND PELVIS WITHOUT CONTRAST  TECHNIQUE: Multidetector CT imaging of the chest, abdomen and pelvis was performed following the standard protocol without IV contrast.  COMPARISON:  CT abdomen and pelvis 06/15/2013.  CT chest 12/03/2012.  FINDINGS: CT CHEST FINDINGS  Borderline heart size. Coronary artery calcification. Normal caliber thoracic aorta with calcification. Esophagus is decompressed. Calcified granulomas in the left lung with calcified left hilar lymph nodes. No significant mediastinal lymphadenopathy. Esophagus is decompressed. Reticular nodular  infiltrates demonstrated in both lungs with patchy distribution. These may represent postinflammatory changes due to chronic infectious process. Bronchial wall thickening without significant bronchiectasis. No change since prior study. No airspace disease or focal consolidation. No pneumothorax. No pleural effusions.  CT ABDOMEN AND PELVIS FINDINGS   Changes of hepatic cirrhosis with diffuse nodular contour to the liver and heterogeneous nodular internal architecture. Splenic enlargement. Upper abdominal varices. Surgical absence of the gallbladder. Calcification of the aorta without aneurysm. The unenhanced appearance of the pancreas, adrenal glands, kidneys, inferior vena cava, and retroperitoneal lymph nodes is unremarkable. Small amount of free fluid in the abdomen and pelvis consistent with ascites. Stomach and small bowel are decompressed. Stool fills the colon without distention. No free air in the abdomen. No findings to suggest abdominal or retroperitoneal hematoma.  Pelvis: Bladder wall is not thickened. No mass or lymphadenopathy in the pelvis. No inflammatory changes in the right lower quadrant or sigmoid colon.  Bones: Normal alignment of the thoracic and lumbar spine. Compression fractures demonstrated at L1 and L2 post kyphoplasty at L1. These changes appear chronic. Of the diffuse degenerative change throughout the thoracic and lumbar spine. Sternum appears intact. Acute fractures are demonstrated in the left 8, 9, 10 ribs. The pelvis, sacrum, fracture of the left superior and inferior pubic rami. Sacrum and hips appear intact.  IMPRESSION: Acute fractures of the left eighth, ninth, and tenth ribs as well as the superior and inferior pubic rami on the left. No findings to suggest internal organ injury or hematoma in the chest, abdomen, or pelvis on noncontrast imaging. Chronic postinflammatory changes in the lungs. Hepatic cirrhosis with evidence of portal venous hypertension including multiple abdominal varices and splenic enlargement. Mild abdominal and pelvic ascites.   Electronically Signed   By: Lucienne Capers M.D.   On: 02/26/2014 23:25   Ct Cervical Spine Wo Contrast  02/27/2014   CLINICAL DATA:  Fall, striking left side of body. Pain in the left leg, hip, knee, flank, back, head, and face.  EXAM: CT HEAD WITHOUT CONTRAST  CT CERVICAL  SPINE WITHOUT CONTRAST  TECHNIQUE: Multidetector CT imaging of the head and cervical spine was performed following the standard protocol without intravenous contrast. Multiplanar CT image reconstructions of the cervical spine were also generated.  COMPARISON:  None.  FINDINGS: CT HEAD FINDINGS  Technically limited due to oblique patient positioning and motion artifact. Mild diffuse cerebral atrophy. No ventricular dilatation. Low-attenuation changes in the deep white matter consistent with small vessel ischemia. No mass effect or midline shift. No abnormal extra-axial fluid collections. Gray-white matter junctions are distinct. Basal cisterns are not effaced. No evidence of acute intracranial hemorrhage. No depressed skull fractures. Visualized paranasal sinuses and mastoid air cells are not opacified.  CT CERVICAL SPINE FINDINGS  There is straightening of the usual cervical lordosis which may be due to patient positioning but ligamentous injury or muscle spasm could also have this appearance. No anterior subluxation. Diffuse degenerative change throughout the cervical spine with narrowed cervical interspaces and associated endplate hypertrophic changes. Degenerative changes in the facet joints. No vertebral compression deformities. No prevertebral soft tissue swelling. C1-2 articulation appears intact. With no focal bone lesion or bone destruction. Bone cortex and trabecular architecture appear intact. Vascular calcifications in the cervical carotid arteries.  IMPRESSION: No acute intracranial abnormalities. Degenerative changes in the cervical spine. Nonspecific straightening of the usual cervical lordosis. No displaced fractures identified.   Electronically Signed   By: Lucienne Capers M.D.   On: 02/27/2014 00:52  Medications: . sodium chloride   Intravenous STAT  . enoxaparin (LOVENOX) injection  30 mg Subcutaneous Q24H  . furosemide  20 mg Oral Daily  . levothyroxine  50 mcg Oral QAC breakfast  .  pantoprazole  40 mg Oral Daily   Or  . pantoprazole (PROTONIX) IV  40 mg Intravenous Daily  . propranolol  10 mg Oral Daily  . spironolactone  50 mg Oral Daily   . dextrose 5 % and 0.45 % NaCl with KCl 20 mEq/L     Prior to Admission medications   Medication Sig Start Date End Date Taking? Authorizing Provider  spironolactone (ALDACTONE) 50 MG tablet Take 50 mg by mouth daily.   Yes Historical Provider, MD  Calcium Carbonate-Vitamin D (CALCIUM 600/VITAMIN D) 600-400 MG-UNIT per tablet Take 1 tablet by mouth daily.      Historical Provider, MD  fexofenadine (ALLEGRA) 180 MG tablet Take 180 mg by mouth as needed.     Historical Provider, MD  furosemide (LASIX) 20 MG tablet Take 1 tablet by mouth daily. 08/26/13   Historical Provider, MD  levothyroxine (SYNTHROID, LEVOTHROID) 50 MCG tablet Take 50 mcg by mouth daily.      Historical Provider, MD  propranolol (INDERAL) 10 MG tablet Take 1 tablet by mouth daily.  07/07/13   Historical Provider, MD  valsartan (DIOVAN) 40 MG tablet Take 40 mg by mouth daily.    Historical Provider, MD  zolpidem (AMBIEN) 10 MG tablet Take 5 mg by mouth at bedtime as needed.     Historical Provider, MD     Assessment/Plan Fall   left rib fracture 8-10 Left superior and inferior pubic rami fractures Hx of AV nodal reentry [place] HX OF ORTHOSTATIC SYNCOPE Recent hospitalization for C diff colitis in [place] Finished oral Vancomycin in August. Breast cancer Hypothyroid Hx of cirrhosis Renal insuffiencey  (admit creatinine 1.6) Hx of breast cancer Weight loss SCD for DVT  Plan:  Monitor VS closely, repeat CBC at noon and type her at that time.  I have ask her to try the pain medicine, and will order IS.  We may need to get medicine and cardiology to see and follow.  She has been on Aldactone and lasix with weight loss.  She has mild renal insuffiencey on admit here.   I will recheck C diff to be sure she is cleared from  this.    LOS: 1 day    Takeila Thayne 02/27/2014

## 2014-02-27 NOTE — ED Notes (Signed)
Pt placed on bedpan

## 2014-02-27 NOTE — Progress Notes (Signed)
Physical Therapy  Note  SATURATION QUALIFICATIONS: (This note is used to comply with regulatory documentation for home oxygen)  Patient Saturations on Room Air at Rest = 90%  Patient Saturations on Room Air while Ambulating = 87%  Patient Saturations on 2 Liters of oxygen while Ambulating = 93-97%  Please briefly explain why patient needs home oxygen: Patient requires supplemental oxygen to maintain oxygen saturation at safe, acceptable levels during activity.  Full PT eval note to follow;  Roney Marion, [place] Pager [phone] Office [phone]

## 2014-02-27 NOTE — ED Notes (Signed)
Pt and Pts daughter have been informed that she has a bed and that [facility] has been called to come and transfer her.

## 2014-02-27 NOTE — Evaluation (Signed)
Physical Therapy Evaluation Patient Details Name: Miranda Blackburn MRN: [id number] DOB: Apr 27, 1927 Today's Date: 02/27/2014   History of Present Illness  Slipped and fell in lobby of restaurant; L 9,8,10th rib fxs and L superoir and inferior pubic rami fxs  Clinical Impression  Pt admitted with above. Pt currently with functional limitations due to the deficits listed below (see PT Problem List).  Pt will benefit from skilled PT to increase their independence and safety with mobility to allow discharge to the venue listed below.   Pt desatted today with activity on Room Air (see other PT note of this date); Hopeful that pt will tolerate deep breathing and not need suppplemental O2 at home, but if we go by this session's numbers, she qualifies.  Pt lives alone, and must be modified independent to dc home, but based on today, I anticipate good progress.      Follow Up Recommendations Home health PT;Supervision - Intermittent    Equipment Recommendations  Rolling walker with 5" wheels;3in1 (PT)    Recommendations for Other Services OT consult     Precautions / Restrictions Precautions Precaution Comments: watch O2 sats on Room Air Restrictions Weight Bearing Restrictions:  (none ordered)      Mobility  Bed Mobility Overal bed mobility: Needs Assistance Bed Mobility: Supine to Sit     Supine to sit: Supervision     General bed mobility comments: Overall moved well once pain under control; got up on Right side of bed  Transfers Overall transfer level: Needs assistance Equipment used: Rolling walker (2 wheeled) Transfers: Sit to/from Stand Sit to Stand: Min guard (without physical contact)         General transfer comment: cues for safety and ahnd placement  Ambulation/Gait Ambulation/Gait assistance: Min guard (with and without physical contact) Ambulation Distance (Feet): 20 Feet Assistive device: Rolling walker (2 wheeled) Gait Pattern/deviations: Step-to  pattern     General Gait Details: cues for optimal gait sequence for taking pressure off of LLE  Stairs            Wheelchair Mobility    Modified Rankin (Stroke Patients Only)       Balance Overall balance assessment: No apparent balance deficits (not formally assessed) (her fall was on a wet, slippery floor)                                           Pertinent Vitals/Pain Pain Assessment: 0-10 Pain Score: 6  Pain Location: L ribs and L groin Pain Descriptors / Indicators: Aching Pain Intervention(s): Premedicated before session;Monitored during session;Repositioned    Home Living Family/patient expects to be discharged to:: Private residence Living Arrangements: Alone Available Help at Discharge: Family;Available PRN/intermittently (Daughter and neighbor can check in) Type of Home: House Home Access: Stairs to enter   Technical brewer of Steps: 1 Home Layout: One level Home Equipment: None (has RW that was her husband's)      Prior Function Level of Independence: Independent         Comments: drives     Hand Dominance        Extremity/Trunk Assessment   Upper Extremity Assessment: Defer to OT evaluation           Lower Extremity Assessment: Overall WFL for tasks assessed;LLE deficits/detail (for simple mobility tasks)   LLE Deficits / Details: Pain with weight bearing LLE/groin; instructed to use RW  to unweigh painful LLE in stance     Communication   Communication: No difficulties  Cognition Arousal/Alertness: Awake/alert Behavior During Therapy: WFL for tasks assessed/performed Overall Cognitive Status: Within Functional Limits for tasks assessed                      General Comments General comments (skin integrity, edema, etc.): Instructed pt in splinting left ribs with pillow to enable deeper breathing    Exercises        Assessment/Plan    PT Assessment Patient needs continued PT services   PT Diagnosis Acute pain   PT Problem List Decreased activity tolerance;Decreased mobility;Decreased knowledge of use of DME;Decreased knowledge of precautions;Cardiopulmonary status limiting activity;Pain  PT Treatment Interventions DME instruction;Gait training;Stair training;Functional mobility training;Therapeutic activities;Therapeutic exercise;Balance training;Patient/family education   PT Goals (Current goals can be found in the Care Plan section) Acute Rehab PT Goals Patient Stated Goal: decr pain PT Goal Formulation: With patient Time For Goal Achievement: 03/06/14 Potential to Achieve Goals: Good    Frequency Min 6X/week   Barriers to discharge Decreased caregiver support must be modified independent to dc home    Co-evaluation               End of Session Equipment Utilized During Treatment: Oxygen Activity Tolerance: Patient tolerated treatment well Patient left: in chair;with call bell/phone within reach Nurse Communication: Mobility status         Time: 3570-1779 PT Time Calculation (min): 37 min   Charges:   PT Evaluation $Initial PT Evaluation Tier I: 1 Procedure PT Treatments $Gait Training: 8-22 mins $Therapeutic Activity: 8-22 mins   PT G Codes:          Quin Hoop 02/27/2014, 5:44 PM  Roney Marion, [place]  Acute Rehabilitation Services Pager [phone] Office [phone]

## 2014-02-28 DIAGNOSIS — E43 Unspecified severe protein-calorie malnutrition: Secondary | ICD-10-CM | POA: Diagnosis present

## 2014-02-28 LAB — COMPREHENSIVE METABOLIC PANEL
ALT: 22 U/L (ref 0–35)
ANION GAP: 6 (ref 5–15)
AST: 39 U/L — ABNORMAL HIGH (ref 0–37)
Albumin: 2.5 g/dL — ABNORMAL LOW (ref 3.5–5.2)
Alkaline Phosphatase: 85 U/L (ref 39–117)
BUN: 24 mg/dL — AB (ref 6–23)
CALCIUM: 8 mg/dL — AB (ref 8.4–10.5)
CO2: 27 mEq/L (ref 19–32)
Chloride: 100 mEq/L (ref 96–112)
Creatinine, Ser: 1.07 mg/dL (ref 0.50–1.10)
GFR calc non Af Amer: 46 mL/min — ABNORMAL LOW (ref >90)
GFR, EST AFRICAN AMERICAN: 53 mL/min — AB (ref >90)
Glucose, Bld: 118 mg/dL — ABNORMAL HIGH (ref 70–99)
Potassium: 4.7 mEq/L (ref 3.7–5.3)
Sodium: 133 mEq/L — ABNORMAL LOW (ref 137–147)
TOTAL PROTEIN: 7.1 g/dL (ref 6.0–8.3)
Total Bilirubin: 0.7 mg/dL (ref 0.3–1.2)

## 2014-02-28 LAB — APTT: APTT: 38 s — AB (ref 24–37)

## 2014-02-28 LAB — CBC
HEMATOCRIT: 28.3 % — AB (ref 36.0–46.0)
HEMOGLOBIN: 9.2 g/dL — AB (ref 12.0–15.0)
MCH: 29 pg (ref 26.0–34.0)
MCHC: 32.5 g/dL (ref 30.0–36.0)
MCV: 89.3 fL (ref 78.0–100.0)
Platelets: 92 10*3/uL — ABNORMAL LOW (ref 150–400)
RBC: 3.17 MIL/uL — ABNORMAL LOW (ref 3.87–5.11)
RDW: 15.3 % (ref 11.5–15.5)
WBC: 3 10*3/uL — ABNORMAL LOW (ref 4.0–10.5)

## 2014-02-28 MED ORDER — BOOST PLUS PO LIQD
237.0000 mL | Freq: Two times a day (BID) | ORAL | Status: DC
  Administered 2014-02-28 – 2014-03-02 (×5): 237 mL via ORAL
  Filled 2014-02-28 (×8): qty 237

## 2014-02-28 NOTE — Progress Notes (Signed)
Therapies have been ordered.  This patient has been seen and I agree with the findings and treatment plan.  Kathryne Eriksson. Dahlia Bailiff, MD, [place] [phone] (pager) [phone] (direct pager) Trauma Surgeon

## 2014-02-28 NOTE — Progress Notes (Signed)
INITIAL NUTRITION ASSESSMENT  DOCUMENTATION CODES Per approved criteria  -Severe malnutrition in the context of chronic illness  Pt meets criteria for severe MALNUTRITION in the context of chronic illness as evidenced by severe fat and muscle wasting and reported intake <75% for >1 month.  INTERVENTION: - Boost Plus BID  NUTRITION DIAGNOSIS: Inadequate oral intake related to chronic illness as evidenced by reported intake less than estimated needs.   Goal: Pt to meet >/= 90% of their estimated nutrition needs   Monitor:  Weight trend, po intake, labs, acceptance of supplements  Reason for Assessment: MST  80 y.o. female  Admitting Dx: <principal problem not specified>  ASSESSMENT: Yanina went out to a [place] last night. It was raining and the floors and signed were wet. Prior to eating she slipped and fell onto her left side.  - Pt reports that she is eating <50% of her meals. She said that she doesn't normally have a good appetite. She is eating a "normal amount for her." Pt reports a weight loss of ~ 8 lbs since August. She has recently purchased Boost per her MD's recommendation. She has been drinking one per day. Pt agreed to drink nutritional supplements while in the hospital even though she doesn't really like the taste.   Nutrition Focused Physical Exam:  Subcutaneous Fat:  Orbital Region: severe wasting Upper Arm Region: moderate wasting Thoracic and Lumbar Region: moderate wasting  Muscle:  Temple Region: severe wasting Clavicle Bone Region: severe wasting Clavicle and Acromion Bone Region: severe wasting Scapular Bone Region: n/a Dorsal Hand: severe wasting Patellar Region: moderate wasting Anterior Thigh Region: moderate wasting Posterior Calf Region: moderate wasting  Edema: none  Labs: Na Low K WNL BUN elevated  Height: Ht Readings from Last 1 Encounters:  02/27/14 5\' 4"  (1.626 m)    Weight: Wt Readings from Last 1 Encounters:   02/27/14 105 lb 9.6 oz (47.9 kg)    Ideal Body Weight: 54.7 kg  % Ideal Body Weight: 88%  Wt Readings from Last 10 Encounters:  02/27/14 105 lb 9.6 oz (47.9 kg)  01/24/14 110 lb 6.4 oz (50.077 kg)  10/18/13 109 lb (49.442 kg)  09/24/13 111 lb (50.349 kg)  12/02/11 117 lb (53.071 kg)  10/11/10 114 lb (51.71 kg)  12/14/09 113 lb (51.256 kg)  06/05/09 111 lb (50.349 kg)  12/08/08 116 lb (52.617 kg)  09/26/08 117 lb 4.8 oz (53.207 kg)    Usual Body Weight: 110-113 lbs  % Usual Body Weight: 93-95%  BMI:  Body mass index is 18.12 kg/(m^2).  Estimated Nutritional Needs: Kcal: 1300-1500 Protein: 80-95 g Fluid: 1.5 L/day  Skin: intact  Diet Order: General  EDUCATION NEEDS: -Education needs addressed   Intake/Output Summary (Last 24 hours) at 02/28/14 1124 Last data filed at 02/28/14 0900  Gross per 24 hour  Intake    520 ml  Output      0 ml  Net    520 ml    Last BM: prior to admission   Labs:   Recent Labs Lab 02/26/14 2155 02/27/14 0725 02/28/14 0540  NA 131*  --  133*  K 4.4  --  4.7  CL 94*  --  100  CO2 25  --  27  BUN 35*  --  24*  CREATININE 1.60* 1.24* 1.07  CALCIUM 9.0  --  8.0*  GLUCOSE 90  --  118*    CBG (last 3)  No results found for this basename: GLUCAP,  in  the last 72 hours  Scheduled Meds: . enoxaparin (LOVENOX) injection  30 mg Subcutaneous Q24H  . furosemide  20 mg Oral Daily  . levothyroxine  50 mcg Oral QAC breakfast  . pantoprazole  40 mg Oral Daily   Or  . pantoprazole (PROTONIX) IV  40 mg Intravenous Daily  . propranolol  10 mg Oral Daily  . spironolactone  50 mg Oral Daily    Continuous Infusions: . dextrose 5 % and 0.45 % NaCl with KCl 20 mEq/L 50 mL/hr at 02/27/14 8115    Past Medical History  Diagnosis Date  . Orthostatic syncope   . Atrioventricular nodal re-entry tachycardia     s/p ablation in [place]  . Hypothyroidism   . Mycobacterium avium-intracellulare infection   . HX: breast cancer   . HTN  (hypertension)   . Migraine headache   . Vertigo   . Anemia   . Hyponatremia   . History of hepatitis   . Recurrent UTI   . DJD (degenerative joint disease)   . Anxiety   . LBBB (left bundle branch block)   . Cirrhosis     Past Surgical History  Procedure Laterality Date  . Cholecystectomy    . Carpal tunnel release    . Hemorrhoid surgery    . Breast lumpectomy    . Tubal ligation    . Tonsillectomy    . Bunionectomy    . Slow pathway modification      AVNRT     Terrace Arabia RD, LDN

## 2014-02-28 NOTE — Evaluation (Signed)
Occupational Therapy Evaluation Patient Details Name: Miranda Blackburn MRN: [id number] DOB: 01-22-1927 Today's Date: 02/28/2014    History of Present Illness Slipped and fell in lobby of restaurant; L 9,8,10th rib fxs and L superoir and inferior pubic rami fxs   Clinical Impression   Pt up to bathroom and transferred on and off the toilet. She states she will have limited assist as daughter lives nearby but works. Her O2 sats decrease to 87% on RA after activity but up to 92% with rest. Reapplied O2. She is overall moving at min guard assist level. Expect good progress and will see for OT to progress ADL independence.     Follow Up Recommendations  Home health OT    Equipment Recommendations  3 in 1 bedside comode    Recommendations for Other Services       Precautions / Restrictions Precautions Precaution Comments: watch O2 sats on Room Air, per chart WBAT and no ROM restrictions Restrictions Weight Bearing Restrictions: No Other Position/Activity Restrictions: WBAT L LE      Mobility Bed Mobility Overal bed mobility: Needs Assistance Bed Mobility: Supine to Sit     Supine to sit: Supervision        Transfers Overall transfer level: Needs assistance Equipment used: Rolling walker (2 wheeled) Transfers: Sit to/from Stand Sit to Stand: Min guard         General transfer comment: cues for safety and hand placement.    Balance                                            ADL Overall ADL's : Needs assistance/impaired Eating/Feeding: Independent;Sitting   Grooming: Wash/dry hands;Min guard;Standing   Upper Body Bathing: Set up;Sitting   Lower Body Bathing: Min guard;Sit to/from stand   Upper Body Dressing : Set up;Sitting   Lower Body Dressing: Min guard;Sit to/from stand   Toilet Transfer: Min guard;Ambulation;Comfort height toilet;RW   Toileting- Water quality scientist and Hygiene: Min guard;Sit to/from stand         General ADL  Comments: Discussed with pt that she is WBAT L LE and no ROM restrictions. pt able to cross L LE up to don/doff sock. Pt has a higher toilet but nothing to stablize UEs on so discussed 3in1 option and pt is interested. Also discussed using her shower seat and having assistance with shower. Pt states she has been fearful lately to do showers and  she states she wants to get grab bars put in. Sats on RA down to 87% briefly on RA after transfering in and out of bathroom. Up to 92% with rest. Reapplied O2. Pt states her daughter works and will have limited assist at home.      Vision                     Perception     Praxis      Pertinent Vitals/Pain Pain Assessment: 0-10 Pain Score: 7  Pain Location: L ribs and L groin Pain Descriptors / Indicators: Aching Pain Intervention(s): Repositioned     Hand Dominance     Extremity/Trunk Assessment Upper Extremity Assessment Upper Extremity Assessment: Overall WFL for tasks assessed           Communication Communication Communication: No difficulties   Cognition Arousal/Alertness: Awake/alert Behavior During Therapy: WFL for tasks assessed/performed Overall Cognitive Status: Within Functional Limits for  tasks assessed                     General Comments       Exercises       Shoulder Instructions      Home Living Family/patient expects to be discharged to:: Private residence Living Arrangements: Alone Available Help at Discharge: Family;Available PRN/intermittently (Daughter and neighbor can check in) Type of Home: House Home Access: Stairs to enter Technical brewer of Steps: 1   Home Layout: One level     Bathroom Shower/Tub: Occupational psychologist: Standard     Home Equipment: Shower seat (has RW that was her husband's)          Prior Functioning/Environment Level of Independence: Independent        Comments: drives    OT Diagnosis: Generalized weakness;Acute pain   OT  Problem List: Decreased strength;Decreased knowledge of use of DME or AE;Pain   OT Treatment/Interventions: Self-care/ADL training;Patient/family education;Therapeutic activities;DME and/or AE instruction    OT Goals(Current goals can be found in the care plan section) Acute Rehab OT Goals Patient Stated Goal: return to independence OT Goal Formulation: With patient Time For Goal Achievement: 03/07/14 Potential to Achieve Goals: Good  OT Frequency: Min 2X/week   Barriers to D/C:            Co-evaluation              End of Session Equipment Utilized During Treatment: Rolling walker  Activity Tolerance: Patient tolerated treatment well Patient left: in chair;with call bell/phone within reach;with chair alarm set   Time: 1000-1025 OT Time Calculation (min): 25 min Charges:  OT General Charges $OT Visit: 1 Procedure OT Evaluation $Initial OT Evaluation Tier I: 1 Procedure OT Treatments $Therapeutic Activity: 8-22 mins G-Codes:    Jules Schick [phone] 02/28/2014, 10:36 AM

## 2014-02-28 NOTE — Progress Notes (Signed)
[facility]  Progress Note   LOS: 2 days   Subjective: Pt c/o pain in left ribs and left groin.  Pain improving, but worsened when ambulating.  Ortho has not seen yet.  Urinating well, no BM yet.  Tolerating diet and appetite good.    Objective: Vital signs in last 24 hours: Temp:  [98 F (36.7 C)-98.4 F (36.9 C)] 98 F (36.7 C) (09/28 0553) Pulse Rate:  [57-59] 57 (09/28 0553) Resp:  [16-18] 16 (09/28 0553) BP: (112-118)/(47-57) 114/47 mmHg (09/28 0553) SpO2:  [95 %-100 %] 98 % (09/28 0553) Weight:  [105 lb 9.6 oz (47.9 kg)] 105 lb 9.6 oz (47.9 kg) (09/27 1228) Last BM Date: 02/26/14  Lab Results:  CBC  Recent Labs  02/27/14 1230 02/28/14 0540  WBC 3.4* 3.0*  HGB 9.4* 9.2*  HCT 28.8* 28.3*  PLT 95* 92*   BMET  Recent Labs  02/26/14 2155 02/27/14 0725 02/28/14 0540  NA 131*  --  133*  K 4.4  --  4.7  CL 94*  --  100  CO2 25  --  27  GLUCOSE 90  --  118*  BUN 35*  --  24*  CREATININE 1.60* 1.24* 1.07  CALCIUM 9.0  --  8.0*    Imaging: Ct Abdomen Pelvis Wo Contrast  02/26/2014   CLINICAL DATA:  Patient fell onto a tile floor striking the left side over body. Pain in the left leg, hip, knee, flank, back, head, and face. No contrast given due to renal insufficiency.  EXAM: CT CHEST, ABDOMEN AND PELVIS WITHOUT CONTRAST  TECHNIQUE: Multidetector CT imaging of the chest, abdomen and pelvis was performed following the standard protocol without IV contrast.  COMPARISON:  CT abdomen and pelvis 06/15/2013.  CT chest 12/03/2012.  FINDINGS: CT CHEST FINDINGS  Borderline heart size. Coronary artery calcification. Normal caliber thoracic aorta with calcification. Esophagus is decompressed. Calcified granulomas in the left lung with calcified left hilar lymph nodes. No significant mediastinal lymphadenopathy. Esophagus is decompressed. Reticular nodular infiltrates demonstrated in both lungs with patchy distribution. These may represent  postinflammatory changes due to chronic infectious process. Bronchial wall thickening without significant bronchiectasis. No change since prior study. No airspace disease or focal consolidation. No pneumothorax. No pleural effusions.  CT ABDOMEN AND PELVIS FINDINGS  Changes of hepatic cirrhosis with diffuse nodular contour to the liver and heterogeneous nodular internal architecture. Splenic enlargement. Upper abdominal varices. Surgical absence of the gallbladder. Calcification of the aorta without aneurysm. The unenhanced appearance of the pancreas, adrenal glands, kidneys, inferior vena cava, and retroperitoneal lymph nodes is unremarkable. Small amount of free fluid in the abdomen and pelvis consistent with ascites. Stomach and small bowel are decompressed. Stool fills the colon without distention. No free air in the abdomen. No findings to suggest abdominal or retroperitoneal hematoma.  Pelvis: Bladder wall is not thickened. No mass or lymphadenopathy in the pelvis. No inflammatory changes in the right lower quadrant or sigmoid colon.  Bones: Normal alignment of the thoracic and lumbar spine. Compression fractures demonstrated at L1 and L2 post kyphoplasty at L1. These changes appear chronic. Of the diffuse degenerative change throughout the thoracic and lumbar spine. Sternum appears intact. Acute fractures are demonstrated in the left 8, 9, 10 ribs. The pelvis, sacrum, fracture of the left superior and inferior pubic rami. Sacrum and hips appear intact.  IMPRESSION: Acute fractures of the left eighth, ninth, and tenth ribs as well as the superior and inferior pubic rami on  the left. No findings to suggest internal organ injury or hematoma in the chest, abdomen, or pelvis on noncontrast imaging. Chronic postinflammatory changes in the lungs. Hepatic cirrhosis with evidence of portal venous hypertension including multiple abdominal varices and splenic enlargement. Mild abdominal and pelvic ascites.    Electronically Signed   By: Lucienne Capers M.D.   On: 02/26/2014 23:25   Ct Head Wo Contrast  02/27/2014   CLINICAL DATA:  Fall, striking left side of body. Pain in the left leg, hip, knee, flank, back, head, and face.  EXAM: CT HEAD WITHOUT CONTRAST  CT CERVICAL SPINE WITHOUT CONTRAST  TECHNIQUE: Multidetector CT imaging of the head and cervical spine was performed following the standard protocol without intravenous contrast. Multiplanar CT image reconstructions of the cervical spine were also generated.  COMPARISON:  None.  FINDINGS: CT HEAD FINDINGS  Technically limited due to oblique patient positioning and motion artifact. Mild diffuse cerebral atrophy. No ventricular dilatation. Low-attenuation changes in the deep white matter consistent with small vessel ischemia. No mass effect or midline shift. No abnormal extra-axial fluid collections. Gray-white matter junctions are distinct. Basal cisterns are not effaced. No evidence of acute intracranial hemorrhage. No depressed skull fractures. Visualized paranasal sinuses and mastoid air cells are not opacified.  CT CERVICAL SPINE FINDINGS  There is straightening of the usual cervical lordosis which may be due to patient positioning but ligamentous injury or muscle spasm could also have this appearance. No anterior subluxation. Diffuse degenerative change throughout the cervical spine with narrowed cervical interspaces and associated endplate hypertrophic changes. Degenerative changes in the facet joints. No vertebral compression deformities. No prevertebral soft tissue swelling. C1-2 articulation appears intact. With no focal bone lesion or bone destruction. Bone cortex and trabecular architecture appear intact. Vascular calcifications in the cervical carotid arteries.  IMPRESSION: No acute intracranial abnormalities. Degenerative changes in the cervical spine. Nonspecific straightening of the usual cervical lordosis. No displaced fractures identified.    Electronically Signed   By: Lucienne Capers M.D.   On: 02/27/2014 00:52   Ct Chest Wo Contrast  02/26/2014   CLINICAL DATA:  Patient fell onto a tile floor striking the left side over body. Pain in the left leg, hip, knee, flank, back, head, and face. No contrast given due to renal insufficiency.  EXAM: CT CHEST, ABDOMEN AND PELVIS WITHOUT CONTRAST  TECHNIQUE: Multidetector CT imaging of the chest, abdomen and pelvis was performed following the standard protocol without IV contrast.  COMPARISON:  CT abdomen and pelvis 06/15/2013.  CT chest 12/03/2012.  FINDINGS: CT CHEST FINDINGS  Borderline heart size. Coronary artery calcification. Normal caliber thoracic aorta with calcification. Esophagus is decompressed. Calcified granulomas in the left lung with calcified left hilar lymph nodes. No significant mediastinal lymphadenopathy. Esophagus is decompressed. Reticular nodular infiltrates demonstrated in both lungs with patchy distribution. These may represent postinflammatory changes due to chronic infectious process. Bronchial wall thickening without significant bronchiectasis. No change since prior study. No airspace disease or focal consolidation. No pneumothorax. No pleural effusions.  CT ABDOMEN AND PELVIS FINDINGS  Changes of hepatic cirrhosis with diffuse nodular contour to the liver and heterogeneous nodular internal architecture. Splenic enlargement. Upper abdominal varices. Surgical absence of the gallbladder. Calcification of the aorta without aneurysm. The unenhanced appearance of the pancreas, adrenal glands, kidneys, inferior vena cava, and retroperitoneal lymph nodes is unremarkable. Small amount of free fluid in the abdomen and pelvis consistent with ascites. Stomach and small bowel are decompressed. Stool fills the colon without distention. No free  air in the abdomen. No findings to suggest abdominal or retroperitoneal hematoma.  Pelvis: Bladder wall is not thickened. No mass or lymphadenopathy in  the pelvis. No inflammatory changes in the right lower quadrant or sigmoid colon.  Bones: Normal alignment of the thoracic and lumbar spine. Compression fractures demonstrated at L1 and L2 post kyphoplasty at L1. These changes appear chronic. Of the diffuse degenerative change throughout the thoracic and lumbar spine. Sternum appears intact. Acute fractures are demonstrated in the left 8, 9, 10 ribs. The pelvis, sacrum, fracture of the left superior and inferior pubic rami. Sacrum and hips appear intact.  IMPRESSION: Acute fractures of the left eighth, ninth, and tenth ribs as well as the superior and inferior pubic rami on the left. No findings to suggest internal organ injury or hematoma in the chest, abdomen, or pelvis on noncontrast imaging. Chronic postinflammatory changes in the lungs. Hepatic cirrhosis with evidence of portal venous hypertension including multiple abdominal varices and splenic enlargement. Mild abdominal and pelvic ascites.   Electronically Signed   By: Lucienne Capers M.D.   On: 02/26/2014 23:25   Ct Cervical Spine Wo Contrast  02/27/2014   CLINICAL DATA:  Fall, striking left side of body. Pain in the left leg, hip, knee, flank, back, head, and face.  EXAM: CT HEAD WITHOUT CONTRAST  CT CERVICAL SPINE WITHOUT CONTRAST  TECHNIQUE: Multidetector CT imaging of the head and cervical spine was performed following the standard protocol without intravenous contrast. Multiplanar CT image reconstructions of the cervical spine were also generated.  COMPARISON:  None.  FINDINGS: CT HEAD FINDINGS  Technically limited due to oblique patient positioning and motion artifact. Mild diffuse cerebral atrophy. No ventricular dilatation. Low-attenuation changes in the deep white matter consistent with small vessel ischemia. No mass effect or midline shift. No abnormal extra-axial fluid collections. Gray-white matter junctions are distinct. Basal cisterns are not effaced. No evidence of acute intracranial  hemorrhage. No depressed skull fractures. Visualized paranasal sinuses and mastoid air cells are not opacified.  CT CERVICAL SPINE FINDINGS  There is straightening of the usual cervical lordosis which may be due to patient positioning but ligamentous injury or muscle spasm could also have this appearance. No anterior subluxation. Diffuse degenerative change throughout the cervical spine with narrowed cervical interspaces and associated endplate hypertrophic changes. Degenerative changes in the facet joints. No vertebral compression deformities. No prevertebral soft tissue swelling. C1-2 articulation appears intact. With no focal bone lesion or bone destruction. Bone cortex and trabecular architecture appear intact. Vascular calcifications in the cervical carotid arteries.  IMPRESSION: No acute intracranial abnormalities. Degenerative changes in the cervical spine. Nonspecific straightening of the usual cervical lordosis. No displaced fractures identified.   Electronically Signed   By: Lucienne Capers M.D.   On: 02/27/2014 00:52     PE: General: pleasant, WD/WN white female who is laying in bed in NAD HEENT: head is normocephalic, atraumatic.  Sclera are noninjected.  PERRL.  Ears and nose without any masses or lesions.  Mouth is pink and moist Heart: regular, rate, and rhythm.  Normal s1,s2. No obvious murmurs, gallops, or rubs noted.  Palpable radial and pedal pulses bilaterally Lungs: CTAB, no wheezes, rhonchi, or rales noted.  Respiratory effort nonlabored, IS to 500.  Left side chest wall tenderness. Abd: soft, NT/ND, +BS, no masses, hernias, or organomegaly MS: all 4 extremities are symmetrical with no cyanosis, clubbing, or edema, left groin tender with hip movement Skin: warm and dry, scattered ecchymosis Psych: A&Ox3 with an appropriate  affect.   Assessment/Plan: Fall  Left rib fracture 8-10  Left superior and inferior pubic rami fractures  Hx of AV nodal reentry tachycardia/hx of  ablation in [place] LBBB  HX OF ORTHOSTATIC SYNCOPE  Recent hospitalization for C diff colitis in [place] Finished oral Vancomycin in August.  Breast cancer  Hypothyroid  Hx of cirrhosis  Renal insuffiencey (admit creatinine 1.6 - now 1.07)  Hx of breast cancer  Weight loss  SCD for DVT  ABL anemia - improving VTE - SCD's, Lovenox FEN - tol reg diet  Dispo -- PT/OT, Ortho consult for pubic rami fx, Lanny Hurst said WBAT and no ROM restrictions   Excell Seltzer Pager: [phone] General Trauma PA Pager: [phone]   02/28/2014

## 2014-02-28 NOTE — Progress Notes (Signed)
Seen and agreed 02/28/2014 Jacqualyn Posey PTA [phone] pager [phone] office

## 2014-02-28 NOTE — Progress Notes (Signed)
Physical Therapy Treatment Patient Details Name: Miranda Blackburn MRN: [id number] DOB: April 27, 1927 Today's Date: 02/28/2014    History of Present Illness Slipped and fell in lobby of restaurant; L 9,8,10th rib fxs and L superoir and inferior pubic rami fxs    PT Comments    Pt progressing well toward goals. Pt is supervision- min guard for all functional mobility activities. Pt require 1 sitting rest break with ambulation 2/2 feeling like she was going to pass out. Pt instructed on LE exercises. Current POC and d/c plan remain appropriate.  Follow Up Recommendations  Home health PT;Supervision - Intermittent     Equipment Recommendations  Rolling walker with 5" wheels;3in1 (PT)    Recommendations for Other Services       Precautions / Restrictions Precautions Precautions: Fall Precaution Comments: WBAT L LE Restrictions Weight Bearing Restrictions: Yes Other Position/Activity Restrictions: WBAT L LE    Mobility  Bed Mobility Overal bed mobility: Needs Assistance Bed Mobility: Sit to Supine     Supine to sit: Supervision Sit to supine: Supervision      Transfers Overall transfer level: Needs assistance Equipment used: Rolling walker (2 wheeled) Transfers: Sit to/from Stand Sit to Stand: Supervision         General transfer comment: cues for safety and hand placement.  Ambulation/Gait Ambulation/Gait assistance: Min guard Ambulation Distance (Feet): 40 Feet (30 ft, 65ft, 1 sitting rest break ') Assistive device: Rolling walker (2 wheeled) Gait Pattern/deviations: Decreased stance time - left;Decreased step length - right;Step-through pattern;Antalgic   Gait velocity interpretation: Below normal speed for age/gender General Gait Details: Cue for sequencing. Vary between step-through and step-to. Pt report feeling lightheaded/woozey with amb, notes having experienced this PTA.  O2 sat 93-96% during seated rest break.   Stairs            Wheelchair  Mobility    Modified Rankin (Stroke Patients Only)       Balance                                    Cognition Arousal/Alertness: Awake/alert Behavior During Therapy: WFL for tasks assessed/performed Overall Cognitive Status: Within Functional Limits for tasks assessed                      Exercises General Exercises - Lower Extremity Ankle Circles/Pumps: AROM;Both;15 reps;Supine Short Arc Quad: AROM;Both;10 reps;Supine Other Exercises Other Exercises: AROM Hip flexion with knee flexion: BLE 5x Other Exercises: Hamstring set: 10x AROM BLE    General Comments General comments (skin integrity, edema, etc.): Instructed in LE exercise. Pt demonstrate exercises she was performing PTA. Pt encouraged to keep LE moving as long as not painful      Pertinent Vitals/Pain Pain Assessment: 0-10 Pain Score: 6  Pain Location: L ribs and L groin Pain Descriptors / Indicators: Aching Pain Intervention(s): Monitored during session;Limited activity within patient's tolerance    Home Living Family/patient expects to be discharged to:: Private residence Living Arrangements: Alone Available Help at Discharge: Family;Available PRN/intermittently (Daughter and neighbor can check in) Type of Home: House Home Access: Stairs to enter   Home Layout: One level Home Equipment: Shower seat (has RW that was her husband's)      Prior Function Level of Independence: Independent      Comments: drives   PT Goals (current goals can now be found in the care plan section) Acute Rehab PT Goals Patient  Stated Goal: return to independence PT Goal Formulation: With patient Time For Goal Achievement: 03/06/14 Potential to Achieve Goals: Good Additional Goals Additional Goal #1: Independently perform deep breathing exercises/IS using pillow splinting prn Progress towards PT goals: Progressing toward goals    Frequency  Min 6X/week    PT Plan Current plan remains appropriate     Co-evaluation             End of Session Equipment Utilized During Treatment: Gait belt Activity Tolerance: Patient tolerated treatment well Patient left: in bed;with call bell/phone within reach     Time: 2979-8921 PT Time Calculation (min): 31 min  Charges:                       G Codes:      Royanne Warshaw 02/28/2014, 11:49 AM Levonne Hubert, SPT

## 2014-03-01 DIAGNOSIS — S32599A Other specified fracture of unspecified pubis, initial encounter for closed fracture: Secondary | ICD-10-CM | POA: Diagnosis present

## 2014-03-01 DIAGNOSIS — W19XXXA Unspecified fall, initial encounter: Secondary | ICD-10-CM | POA: Diagnosis present

## 2014-03-01 DIAGNOSIS — D62 Acute posthemorrhagic anemia: Secondary | ICD-10-CM | POA: Diagnosis not present

## 2014-03-01 LAB — VITAMIN D 25 HYDROXY (VIT D DEFICIENCY, FRACTURES): Vit D, 25-Hydroxy: 47 ng/mL (ref 30–89)

## 2014-03-01 MED ORDER — POLYETHYLENE GLYCOL 3350 17 G PO PACK
17.0000 g | PACK | Freq: Every day | ORAL | Status: DC
  Administered 2014-03-01 – 2014-03-02 (×2): 17 g via ORAL
  Filled 2014-03-01 (×2): qty 1

## 2014-03-01 MED ORDER — MORPHINE SULFATE 2 MG/ML IJ SOLN: 2.0000 mg | INTRAMUSCULAR | Status: DC | PRN

## 2014-03-01 MED ORDER — TRAMADOL HCL 50 MG PO TABS
50.0000 mg | ORAL_TABLET | Freq: Four times a day (QID) | ORAL | Status: DC | PRN
  Administered 2014-03-01 – 2014-03-02 (×4): 100 mg via ORAL
  Filled 2014-03-01 (×4): qty 2

## 2014-03-01 NOTE — Progress Notes (Signed)
Patient ID: Miranda Blackburn, female   DOB: [birth year], 80 y.o.   MRN: [id number]   LOS: 3 days   Subjective: Some nausea last night but no emesis.   Objective: Vital signs in last 24 hours: Temp:  [98.1 F (36.7 C)-98.8 F (37.1 C)] 98.8 F (37.1 C) (09/29 0600) Pulse Rate:  [57-60] 60 (09/29 0600) Resp:  [16] 16 (09/29 0600) BP: (111-127)/(51-59) 111/51 mmHg (09/29 0600) SpO2:  [87 %-99 %] 99 % (09/29 0600) Weight:  [104 lb 0.9 oz (47.2 kg)] 104 lb 0.9 oz (47.2 kg) (09/29 0600) Last BM Date: 02/26/14   IS: 23ml   Physical Exam General appearance: alert and no distress Resp: clear to auscultation bilaterally Cardio: regular rate and rhythm GI: normal findings: bowel sounds normal and soft, non-tender   Assessment/Plan: Fall  Left rib fracture 8-10 -- Pulmonary toilet Left superior and inferior pubic rami fractures -- WBAT ABL anemia - Stable FEN - tol reg diet  VTE - SCD's, Lovenox  Dispo -- CIR consult    Lisette Abu, PA-C Pager: [phone] General Trauma PA Pager: [phone]  03/01/2014

## 2014-03-01 NOTE — Progress Notes (Signed)
Occupational Therapy Treatment Patient Details Name: Miranda Blackburn MRN: [id number] DOB: 12-15-1926 Today's Date: 03/01/2014    History of present illness Slipped and fell in lobby of restaurant; L 9,8,10th rib fxs and L superoir and inferior pubic rami fxs   OT comments  Pt seen for acute OT session. Pt making some progress towards acute OT goals; Limited by pain this session. Pt lives alone and will have limited supervision/assistance at d/c. Recommending SNF at this time.  Follow Up Recommendations  SNF    Equipment Recommendations  3 in 1 bedside comode    Recommendations for Other Services      Precautions / Restrictions Precautions Precautions: Fall Precaution Comments: WBAT L LE Restrictions Weight Bearing Restrictions: Yes RLE Weight Bearing: Weight bearing as tolerated LLE Weight Bearing: Weight bearing as tolerated Other Position/Activity Restrictions: WBAT L LE       Mobility Bed Mobility Overal bed mobility: Needs Assistance Bed Mobility: Sit to Supine       Sit to supine: Supervision   General bed mobility comments: HOB elevated due to pain, good technique  Transfers Overall transfer level: Needs assistance Equipment used: Rolling walker (2 wheeled) Transfers: Sit to/from Stand Sit to Stand: Min guard         General transfer comment: cues for technique    Balance                                   ADL Overall ADL's : Needs assistance/impaired     Grooming: Wash/dry hands;Min Dispensing optician: Min guard;Ambulation;Comfort height toilet;RW   Toileting- Water quality scientist and Hygiene: Min guard;Sit to/from stand       Functional mobility during ADLs: Min guard;Rolling walker General ADL Comments: Educated on techniques and AE for ADL completion. PT reports increased pain with bending forward dicussed use of reacher for getting items of floor, discussed dresing technique.       Vision                     Perception     Praxis      Cognition   Behavior During Therapy: WFL for tasks assessed/performed Overall Cognitive Status: Within Functional Limits for tasks assessed                       Extremity/Trunk Assessment               Exercises     Shoulder Instructions       General Comments      Pertinent Vitals/ Pain       Pain Assessment: 0-10 Pain Score: 5  Pain Location: L ribs Pain Descriptors / Indicators: Aching Pain Intervention(s): Limited activity within patient's tolerance;Monitored during session;Repositioned  Home Living                                          Prior Functioning/Environment              Frequency Min 2X/week     Progress Toward Goals  OT Goals(current goals can now be found in the care plan section)  Progress towards OT goals: Progressing toward goals  Acute Rehab OT Goals OT Goal  Formulation: With patient Time For Goal Achievement: 03/07/14 Potential to Achieve Goals: Good ADL Goals Pt Will Transfer to Toilet: with supervision;ambulating;bedside commode Pt Will Perform Toileting - Clothing Manipulation and hygiene: with supervision;sit to/from stand Pt Will Perform Tub/Shower Transfer: Shower transfer;with supervision;shower seat Additional ADL Goal #1: Pt will gather items for B/D with supervision and walker.  Plan Discharge plan needs to be updated    Co-evaluation                 End of Session Equipment Utilized During Treatment: Rolling walker   Activity Tolerance Patient limited by pain;Patient tolerated treatment well   Patient Left in bed;with call bell/phone within reach   Nurse Communication          Time: 2481-8590 OT Time Calculation (min): 23 min  Charges: OT General Charges $OT Visit: 1 Procedure OT Treatments $Self Care/Home Management : 23-37 mins  Hortencia Pilar 03/01/2014, 2:04 PM

## 2014-03-01 NOTE — Progress Notes (Signed)
Pt minguard assistance level with therapy. Recommend home with [facility] if sufficient supports available at home or SNF. [phone]

## 2014-03-01 NOTE — Progress Notes (Signed)
Discussed case, POC, pt status with Almyra Free, PTA;  Agree with Rehab consult to explore possibility of CIR stay to maximize independence and safety with mobility prior to dc home;   Roney Marion, [place] Pager [phone] Office [phone]

## 2014-03-01 NOTE — Consult Note (Signed)
Physical Medicine and Rehabilitation Consult Reason for Consult: Multitrauma after a fall Referring Physician: Trauma services   HPI: Miranda Blackburn is a 80 y.o. right-handed female with history of orthostatic hypotension, vertigo. Admitted 02/27/2014 after a fall while going out to eat at a [employer]. It was raining and the floors were wet. There was no loss of consciousness. Patient had been independent prior to admission living alone and driving. Cranial CT scan was negative. X-rays and imaging revealed left-sided rib fractures as well as superior and inferior pubic rami fractures. Conservative care weightbearing as tolerated. Hospital course pain management. Subcutaneous Lovenox added for DVT prophylaxis. Physical and occupational therapy evaluations completed with recommendations of physical medicine rehabilitation consult.   Review of Systems  Musculoskeletal: Positive for myalgias.  Neurological: Positive for headaches.  Psychiatric/Behavioral: The patient has insomnia.        Anxiety  All other systems reviewed and are negative.  Past Medical History  Diagnosis Date  . Orthostatic syncope   . Atrioventricular nodal re-entry tachycardia     s/p ablation in [place]  . Hypothyroidism   . Mycobacterium avium-intracellulare infection   . HX: breast cancer   . HTN (hypertension)   . Migraine headache   . Vertigo   . Anemia   . Hyponatremia   . History of hepatitis   . Recurrent UTI   . DJD (degenerative joint disease)   . Anxiety   . LBBB (left bundle branch block)   . Cirrhosis    Past Surgical History  Procedure Laterality Date  . Cholecystectomy    . Carpal tunnel release    . Hemorrhoid surgery    . Breast lumpectomy    . Tubal ligation    . Tonsillectomy    . Bunionectomy    . Slow pathway modification      AVNRT    Family History  Problem Relation Age of Onset  . Coronary artery disease    . Stroke    . Asthma     Social History:   reports that she has never smoked. She does not have any smokeless tobacco history on file. She reports that she does not drink alcohol or use illicit drugs. Allergies:  Allergies  Allergen Reactions  . Penicillins Other (See Comments)    Unknown allergic reaction many years ago  . Red Dye     vomiting  . Sulfonamide Derivatives Other (See Comments)    Dizziness and vomiting   Medications Prior to Admission  Medication Sig Dispense Refill  . Calcium Carbonate-Vitamin D (CALTRATE 600+D PO) Take 2 tablets by mouth at bedtime.      . ciprofloxacin (CIPRO) 500 MG tablet Take 500 mg by mouth 2 (two) times daily. 3 day course started 02/24/14 pm      . fexofenadine (ALLEGRA) 180 MG tablet Take 180 mg by mouth daily as needed for allergies.       . furosemide (LASIX) 20 MG tablet Take 20 mg by mouth daily.       Marland Kitchen levothyroxine (SYNTHROID, LEVOTHROID) 50 MCG tablet Take 50 mcg by mouth daily before breakfast.       . naproxen sodium (ALEVE) 220 MG tablet Take 440 mg by mouth daily as needed (pain).      . propranolol (INDERAL) 10 MG tablet Take 10 mg by mouth daily before lunch.       . spironolactone (ALDACTONE) 50 MG tablet Take 50 mg by mouth daily.      Marland Kitchen  valsartan (DIOVAN) 40 MG tablet Take 40 mg by mouth daily.      Marland Kitchen zolpidem (AMBIEN) 10 MG tablet Take 2.5-5 mg by mouth at bedtime as needed for sleep.         Home: Home Living Family/patient expects to be discharged to:: Private residence Living Arrangements: Alone Available Help at Discharge: Family;Available PRN/intermittently (Daughter and neighbor can check in) Type of Home: House Home Access: Stairs to enter Technical brewer of Steps: 1 Home Layout: One level Home Equipment: Shower seat (has RW that was her husband's)  Functional History: Prior Function Level of Independence: Independent Comments: drives Functional Status:  Mobility: Bed Mobility Overal bed mobility: Needs Assistance Bed Mobility: Sit to  Supine Supine to sit: Supervision Sit to supine: Supervision General bed mobility comments: Overall moved well once pain under control; got up on Right side of bed. did use rails but HOB flat Transfers Overall transfer level: Needs assistance Equipment used: Rolling walker (2 wheeled) Transfers: Sit to/from Stand Sit to Stand: Min guard General transfer comment: cues for safety and hand placement. Ambulation/Gait Ambulation/Gait assistance: Min guard Ambulation Distance (Feet): 60 Feet Assistive device: Rolling walker (2 wheeled) Gait Pattern/deviations: Step-through pattern;Decreased stride length Gait velocity interpretation: Below normal speed for age/gender General Gait Details: Cue for sequencing. Vary between step-through and step-to.     ADL: ADL Overall ADL's : Needs assistance/impaired Eating/Feeding: Independent;Sitting Grooming: Wash/dry hands;Min guard;Standing Upper Body Bathing: Set up;Sitting Lower Body Bathing: Min guard;Sit to/from stand Upper Body Dressing : Set up;Sitting Lower Body Dressing: Min guard;Sit to/from stand Toilet Transfer: Min guard;Ambulation;Comfort height toilet;RW Toileting- Water quality scientist and Hygiene: Min guard;Sit to/from stand General ADL Comments: Discussed with pt that she is WBAT L LE and no ROM restrictions. pt able to cross L LE up to don/doff sock. Pt has a higher toilet but nothing to stablize UEs on so discussed 3in1 option and pt is interested. Also discussed using her shower seat and having assistance with shower. Pt states she has been fearful lately to do showers and  she states she wants to get grab bars put in. Sats on RA down to 87% briefly on RA after transfering in and out of bathroom. Up to 92% with rest. Reapplied O2. Pt states her daughter works and will have limited assist at home.   Cognition: Cognition Overall Cognitive Status: Within Functional Limits for tasks assessed Cognition Arousal/Alertness:  Awake/alert Behavior During Therapy: WFL for tasks assessed/performed Overall Cognitive Status: Within Functional Limits for tasks assessed  Blood pressure 111/51, pulse 60, temperature 98.8 F (37.1 C), temperature source Oral, resp. rate 16, height 5\' 4"  (1.626 m), weight 47.2 kg (104 lb 0.9 oz), SpO2 99.00%. Physical Exam  Constitutional: She is oriented to person, place, and time.  HENT:  Head: Normocephalic.  Eyes: EOM are normal.  Neck: Normal range of motion. Neck supple. No thyromegaly present.  Cardiovascular: Normal rate and regular rhythm.   Respiratory: Effort normal and breath sounds normal. No respiratory distress.  GI: Soft. Bowel sounds are normal. She exhibits no distension.  Musculoskeletal:  Left chest wall tenderness to palpation.  Neurological: She is alert and oriented to person, place, and time.  Skin: Skin is warm and dry.  Psychiatric: She has a normal mood and affect.    Results for orders placed during the hospital encounter of 02/26/14 (from the past 24 hour(s))  VITAMIN D 25 HYDROXY     Status: None   Collection Time    02/28/14 10:30 AM  Result Value Ref Range   Vit D, 25-Hydroxy 47  30 - 89 ng/mL   No results found.  Assessment/Plan: Diagnosis: rib, pubic rami fx's after fall 1. Does the need for close, 24 hr/day medical supervision in concert with the patient's rehab needs make it unreasonable for this patient to be served in a less intensive setting? No 2. Co-Morbidities requiring supervision/potential complications:   3. Due to bowel management, safety, skin/wound care and disease management, does the patient require 24 hr/day rehab nursing? No 4. Does the patient require coordinated care of a physician, rehab nurse, PT, OT to address physical and functional deficits in the context of the above medical diagnosis(es)? No Addressing deficits in the following areas: balance, locomotion, strength, bowel/bladder control and dressing 5. Can the  patient actively participate in an intensive therapy program of at least 3 hrs of therapy per day at least 5 days per week? Potentially 6. The potential for patient to make measurable gains while on inpatient rehab is fair 7. Anticipated functional outcomes upon discharge from inpatient rehab are n/a  with PT, n/a with OT, n/a with SLP. 8. Estimated rehab length of stay to reach the above functional goals is: n/a 9. Does the patient have adequate social supports to accommodate these discharge functional goals? Potentially 10. Anticipated D/C setting: Home 11. Anticipated post D/C treatments: [place] therapy 12. Overall Rehab/Functional Prognosis: good  RECOMMENDATIONS: This patient's condition is appropriate for continued rehabilitative care in the following setting: SNF and [facility] Patient has agreed to participate in recommended program. Potentially Note that insurance prior authorization may be required for reimbursement for recommended care.  Comment: If patient can't be acommodated at home, then SNF placement will need to be pursued.     03/01/2014

## 2014-03-01 NOTE — Progress Notes (Signed)
Physical Therapy Treatment Patient Details Name: Miranda Blackburn MRN: [id number] DOB: [birth year] Today's Date: 03/01/2014    History of Present Illness Slipped and fell in lobby of restaurant; L 9,8,10th rib fxs and L superoir and inferior pubic rami fxs    PT Comments    Patient is progressing with mobility but continues to be at a Minguard level for all mobility. Patient lives alone but does have supportive family, however, they have to work during the day. Patient will require increased therapy prior to DC home alone. At this time, I recommend CIR to increase functional independence and I do believe patient could return to Mod I level with a CIR stay. Will update plan according and trauma PA made aware.   Follow Up Recommendations  CIR     Equipment Recommendations  Rolling walker with 5" wheels;3in1 (PT)    Recommendations for Other Services Rehab consult     Precautions / Restrictions Precautions Precautions: Fall Precaution Comments: WBAT L LE Restrictions Other Position/Activity Restrictions: WBAT L LE    Mobility  Bed Mobility Overal bed mobility: Needs Assistance Bed Mobility: Sit to Supine     Supine to sit: Supervision     General bed mobility comments: Overall moved well once pain under control; got up on Right side of bed. did use rails but HOB flat  Transfers Overall transfer level: Needs assistance Equipment used: Rolling walker (2 wheeled)   Sit to Stand: Min guard         General transfer comment: cues for safety and hand placement.  Ambulation/Gait Ambulation/Gait assistance: Min guard Ambulation Distance (Feet): 60 Feet Assistive device: Rolling walker (2 wheeled) Gait Pattern/deviations: Step-through pattern;Decreased stride length   Gait velocity interpretation: Below normal speed for age/gender General Gait Details: Cue for sequencing. Vary between step-through and step-to.    Stairs            Wheelchair Mobility     Modified Rankin (Stroke Patients Only)       Balance                                    Cognition Arousal/Alertness: Awake/alert Behavior During Therapy: WFL for tasks assessed/performed Overall Cognitive Status: Within Functional Limits for tasks assessed                      Exercises      General Comments        Pertinent Vitals/Pain Pain Score: 6  Pain Location: L ribs and pelvic region Pain Descriptors / Indicators: Aching Pain Intervention(s): Monitored during session    Home Living                      Prior Function            PT Goals (current goals can now be found in the care plan section) Progress towards PT goals: Progressing toward goals    Frequency  Min 6X/week    PT Plan Discharge plan needs to be updated    Co-evaluation             End of Session Equipment Utilized During Treatment: Gait belt Activity Tolerance: Patient tolerated treatment well Patient left: in chair;with call bell/phone within reach     Time: 0998-3382 PT Time Calculation (min): 26 min  Charges:  $Gait Training: 8-22 mins $Therapeutic Activity: 8-22 mins  G Codes:      Jacqualyn Posey 03/01/2014, 8:22 AM 03/01/2014 Jacqualyn Posey PTA [phone] pager [phone] office

## 2014-03-02 MED ORDER — TRAMADOL HCL 50 MG PO TABS: 50.0000 mg | ORAL_TABLET | Freq: Four times a day (QID) | ORAL | Status: AC | PRN

## 2014-03-02 MED ORDER — BISACODYL 10 MG RE SUPP: 10.0000 mg | Freq: Every day | RECTAL | Status: AC | PRN

## 2014-03-02 MED ORDER — POLYETHYLENE GLYCOL 3350 17 G PO PACK: 17.0000 g | PACK | Freq: Every day | ORAL | Status: AC | PRN

## 2014-03-02 MED ORDER — BISACODYL 10 MG RE SUPP: 10.0000 mg | Freq: Every day | RECTAL | Status: DC | PRN

## 2014-03-02 NOTE — Care Management Note (Signed)
  Page 1 of 1   03/02/2014     3:31:39 PM CARE MANAGEMENT NOTE 03/02/2014  Patient:  Miranda Blackburn, Miranda Blackburn   Account Number:  [id number]  Date Initiated:  03/02/2014  Documentation initiated by:  Magdalen Spatz  Subjective/Objective Assessment:     Action/Plan:   Anticipated DC Date:  03/02/2014   Anticipated DC Plan:  [place]  In-house referral  Clinical Social Worker         Choice offered to / List presented to:             Status of service:   Medicare Important Message given?  YES (If response is "NO", the following Medicare IM given date fields will be blank) Date Medicare IM given:  03/02/2014 Medicare IM given by:  Magdalen Spatz Date Additional Medicare IM given:   Additional Medicare IM given by:    Discharge Disposition:    Per UR Regulation:  Reviewed for med. necessity/level of care/duration of stay  If discussed at [place] of Stay Meetings, dates discussed:    Comments:

## 2014-03-02 NOTE — Discharge Instructions (Signed)

## 2014-03-02 NOTE — Clinical Social Work Placement (Signed)
Clinical Social Work Department CLINICAL SOCIAL WORK PLACEMENT NOTE 03/02/2014  Patient:  Miranda Blackburn, Miranda Blackburn  Account Number:  [id number] [place] date:  02/26/2014  Clinical Social Worker:  Wylene Men  Date/time:  03/02/2014 11:03 AM  Clinical Social Work is seeking post-discharge placement for this patient at the following level of care:   SKILLED NURSING   (*CSW will update this form in Epic as items are completed)   03/02/2014  Patient/family provided with [place] Department of Clinical Social Work's list of facilities offering this level of care within the geographic area requested by the patient (or if unable, by the patient's family).  03/02/2014  Patient/family informed of their freedom to choose among providers that offer the needed level of care, that participate in Medicare, Medicaid or managed care program needed by the patient, have an available bed and are willing to accept the patient.  03/02/2014  Patient/family informed of [facility]' ownership interest in [facility], as well as of the fact that they are under no obligation to receive care at this facility.  PASARR submitted to EDS on 03/02/2014 PASARR number received on 03/02/2014  FL2 transmitted to all facilities in geographic area requested by pt/family on  03/02/2014 FL2 transmitted to all facilities within larger geographic area on 03/02/2014  Patient informed that his/her managed care company has contracts with or will negotiate with  certain facilities, including the following:     Patient/family informed of bed offers received:   Patient chooses bed at  Physician recommends and patient chooses bed at    Patient to be transferred to  on   Patient to be transferred to facility by  Patient and family notified of transfer on  Name of family member notified:    The following physician request were entered in Epic:   Additional Comments:  Nonnie Done, [place] [phone]  Psychiatric  & Orthopedics (5N 1-16) Clinical Social Worker

## 2014-03-02 NOTE — Clinical Social Work Psychosocial (Signed)
Clinical Social Work Department BRIEF PSYCHOSOCIAL ASSESSMENT 03/02/2014  Patient:  Miranda Blackburn, Miranda Blackburn     Account Number:  [id number]     [place] date:  02/26/2014  Clinical Social Worker:  Wylene Men  Date/Time:  03/02/2014 10:58 AM  Referred by:  Physician  Date Referred:  03/02/2014 Referred for  SNF Placement  Psychosocial assessment   Other Referral:   none   Interview type:  Patient Other interview type:   none    PSYCHOSOCIAL DATA Living Status:  ALONE Admitted from facility:   Level of care:   Primary support name:  Verdis Frederickson Primary support relationship to patient:  CHILD, ADULT Degree of support available:   adequate    CURRENT CONCERNS Current Concerns  Post-Acute Placement   Other Concerns:   none    SOCIAL WORK ASSESSMENT / PLAN CSW assessed pt at bedside.  Pt was alert and oriented sitting in the bedside chair during the time of the assessment.  CSW introduced self and CSW role.  Pt is familiar with the PT recommendation of SNF/STR and is agreeable.  Pt is hopeful regarding prognosis and life after recovery.    Pt is agreeable to SNF search and prefers [facility]. Pt has no working knowledge of SNFs.  CSW will continue to assist.  Pt requests her daughter, Verdis Frederickson assist with decisions. pt to update daughter.   Assessment/plan status:  Psychosocial Support/Ongoing Assessment of Needs Other assessment/ plan:   FL2  PASARR   Information/referral to community resources:   SNF    PATIENT'S/FAMILY'S RESPONSE TO PLAN OF CARE: Pt is agreeable to SNF search and is appreciative of CSW assistance and support.       Nonnie Done, [place] [phone]  Psychiatric & Orthopedics (5N 1-16) Clinical Social Worker

## 2014-03-02 NOTE — Progress Notes (Signed)
[facility]  Progress Note   LOS: 4 days   Subjective: Pt doing well.  Pain improved after she resumed pain meds.  She had been trying to hold out from taking them.  IS only at 500-750.  Ambulating well, Rehab recommended SNF at discharge since she lives alone at home.  No N/V, last BM on 9/26.    Objective: Vital signs in last 24 hours: Temp:  [97.9 F (36.6 C)-99.3 F (37.4 C)] 98.4 F (36.9 C) (09/30 0532) Pulse Rate:  [65-78] 65 (09/30 0532) Resp:  [17-18] 17 (09/30 0532) BP: (123-137)/(52-57) 124/55 mmHg (09/30 0532) SpO2:  [96 %-98 %] 97 % (09/30 0532) Weight:  [102 lb 8.2 oz (46.5 kg)] 102 lb 8.2 oz (46.5 kg) (09/30 0532) Last BM Date: 02/26/14  Lab Results:  CBC  Recent Labs  02/27/14 1230 02/28/14 0540  WBC 3.4* 3.0*  HGB 9.4* 9.2*  HCT 28.8* 28.3*  PLT 95* 92*   BMET  Recent Labs  02/27/14 0725 02/28/14 0540  NA  --  133*  K  --  4.7  CL  --  100  CO2  --  27  GLUCOSE  --  118*  BUN  --  24*  CREATININE 1.24* 1.07  CALCIUM  --  8.0*    Imaging: No results found.   PE: General: pleasant, WD/WN white female who is laying in bed in NAD  Heart: regular, rate, and rhythm. Normal s1,s2. No obvious murmurs, gallops, or rubs noted. Palpable radial and pedal pulses bilaterally  Lungs: CTAB, no wheezes, rhonchi, or rales noted. Respiratory effort nonlabored, IS to 500. Left side chest wall tenderness.  Abd: soft, NT/ND, +BS, no masses, hernias, or organomegaly  MS: all 4 extremities are symmetrical with no cyanosis, clubbing, or edema, left groin tender with hip movement  Skin: warm and dry, scattered ecchymosis  Psych: A&Ox3 with an appropriate affect.   Assessment/Plan: Fall  Left rib fracture 8-10 -- Pulmonary toilet  Left superior and inferior pubic rami fractures -- WBAT and ROM exercises fine, f/u with Handy in 2-3 weeks Osteopenia - DEXA as outpatient ABL anemia - Stable  FEN - tol reg diet, bowel regimen VTE -  SCD's, Lovenox  Dispo -- CIR denied acceptance, will need SNF at discharge, social work consult placed    Coralie Keens, [place] Pager: [phone] General Trauma PA Pager: [phone]   03/02/2014

## 2014-03-02 NOTE — Progress Notes (Signed)
[facility] transportation arranged for pt to go to [facility].  Pt agreeable to transport.

## 2014-03-02 NOTE — Consult Note (Signed)
Orthopaedic Trauma Service Consult  Pt seen and examined, see dictation for full report   Dictation #: [id number]   A/P  Ground Level fall with insufficiency fracture to L superior and inferior pubic rami  WBAT ROM as tolerated Ice prn Pain control as needed  DEXA as outpt  Follow up in 2-3 weeks  Jari Pigg, PA-C [facility] [phone] (P) 03/02/2014 8:27 AM

## 2014-03-02 NOTE — Progress Notes (Signed)
Physical Therapy Treatment Patient Details Name: Miranda Blackburn MRN: [id number] DOB: 05-Aug-1926 Today's Date: 03/02/2014    History of Present Illness Slipped and fell in lobby of restaurant; L 9,8,10th rib fxs and L superoir and inferior pubic rami fxs    PT Comments    Patient continues to be highly motivated and progressing with overall mobility. Per CIR, patient at too high level to come to rehab and now will plan on [place] to SNF to increase functional independence prior to returning home alone. Will update appropriately and continue to mobility POC  Follow Up Recommendations  SNF     Equipment Recommendations  Rolling walker with 5" wheels;3in1 (PT)    Recommendations for Other Services       Precautions / Restrictions Precautions Precautions: Fall Precaution Comments: WBAT L LE Restrictions Weight Bearing Restrictions: Yes RLE Weight Bearing: Weight bearing as tolerated LLE Weight Bearing: Weight bearing as tolerated    Mobility  Bed Mobility Overal bed mobility: Needs Assistance Bed Mobility: Sit to Supine     Supine to sit: Supervision        Transfers Overall transfer level: Needs assistance Equipment used: Rolling walker (2 wheeled)   Sit to Stand: Supervision         General transfer comment: Cues for hand placement and not to pull up from RW  Ambulation/Gait Ambulation/Gait assistance: Min guard Ambulation Distance (Feet): 70 Feet (x2) Assistive device: Rolling walker (2 wheeled) Gait Pattern/deviations: Step-through pattern;Decreased stride length   Gait velocity interpretation: Below normal speed for age/gender General Gait Details: Cues to increase gait speed as tolerated and attempt step through pattern continuously.   Stairs            Wheelchair Mobility    Modified Rankin (Stroke Patients Only)       Balance                                    Cognition Arousal/Alertness: Awake/alert Behavior During  Therapy: WFL for tasks assessed/performed Overall Cognitive Status: Within Functional Limits for tasks assessed                      Exercises      General Comments        Pertinent Vitals/Pain Pain Score: 5  Pain Location: L ribs Pain Descriptors / Indicators: Aching Pain Intervention(s): Premedicated before session    Home Living                      Prior Function            PT Goals (current goals can now be found in the care plan section) Progress towards PT goals: Progressing toward goals    Frequency  Min 6X/week    PT Plan Discharge plan needs to be updated    Co-evaluation             End of Session Equipment Utilized During Treatment: Gait belt Activity Tolerance: Patient tolerated treatment well Patient left: in chair;with call bell/phone within reach     Time: 0755-0821 PT Time Calculation (min): 26 min  Charges:  $Gait Training: 23-37 mins                    G Codes:      Jacqualyn Posey 03/02/2014, 8:26 AM 03/02/2014 Jacqualyn Posey PTA [phone] pager [phone] office

## 2014-03-02 NOTE — Discharge Summary (Signed)
[facility] Trauma Service Discharge Summary   Patient ID: Miranda Blackburn MRN: [id number] DOB/AGE: 1927/02/16 80 y.o.  Admit date: 02/26/2014 Discharge date: 03/02/2014  Discharge Diagnoses Patient Active Problem List   Diagnosis Date Noted  . Fall 03/01/2014  . Fracture of multiple pubic rami 03/01/2014  . Acute blood loss anemia 03/01/2014  . Protein-calorie malnutrition, severe 02/28/2014  . Rib fractures 02/27/2014  . Shortness of breath 09/26/2013  . LBBB 12/08/2008  . BACTEREMIA, MYCOBACTERIUM AVIUM COMPLEX 11/25/2008  . BREAST CANCER 11/25/2008  . HYPONATREMIA 11/25/2008  . ANXIETY 11/25/2008  . MIGRAINE HEADACHE 11/25/2008  . UTI 11/25/2008  . ORTHOSTATIC DIZZINESS 11/25/2008  . TACHYCARDIA 11/25/2008  . HEPATITIS 09/26/2008  . HYPOTHYROIDISM 07/01/2008  . ANEMIA-NOS 07/01/2008  . HYPERTENSION 07/01/2008  . AV NODAL REENTRY TACHYCARDIA 07/01/2008  . ALLERGIC RHINITIS 07/01/2008  . PULMONARY NODULE 07/01/2008  . PEPTIC ULCER DISEASE 07/01/2008  . OSTEOPOROSIS 07/01/2008  . BREAST CANCER, HX OF 07/01/2008  . SYNCOPE, HX OF 07/01/2008  . RADIATION THERAPY, HX OF 07/01/2008  . 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 CYSTITIS, RECURRENT 06/28/2008    Consultants Dr. Altamese Parcoal (Ortho)   Procedures None  Hospital Course:  80 y/o white female presented to [facility] after a slip and fall.  She went out to a [place] with her family on 02/26/14. It was raining and the floors were wet. Prior to eating she slipped and fell onto her left side. No loss of consciousness. She was evaluated and [facility] [place]. She was found to have multiple left rib fractures and left pubic rami fractures.  She was accepted in transfer for admission to the trauma service at [facility]. She complains of left rib pain.   Patient was admitted and was transferred to the floor for further evaluation and pain management.  Diet was  advanced as tolerated.  She was evaluated by Dr. Marcelino Scot regarding her left inferior/superior pubic rami fracture.  They recommended weight  Bearing and ROM exercises as tolerated.  She will need a DEXA scan as an outpatient secondary to findings consistent with osteopenia.  She will need to follow up with Dr. Marcelino Scot in 2-3 weeks as an outpatient.  Of note she was treated for C.diff in July 2015 and finished vancomycin in August.  We attempted to get a repeat C.diff but was unable to obtain due to insufficient BM's.  Would recommend checking this while at the SNF to ensure it is resolved.  She had mild renal insufficiency which quickly resolved.  The patient was followed by PT/OT who recommended rehab initially, but then settled on SNF given her quick improvement.  She lives alone and thus can not go home alone as she currently needs 24hr assistance.  On HD #4, the patient was voiding well, tolerating diet, ambulating well, pain well controlled, vital signs stable, and felt stable for discharge to SNF.  Patient will follow up in our office as needed and knows to call with questions or concerns.  She will need to practice aggressive pulmonary toilet with deep breathing (incentive spirometry) and coughing, thus she will need adequate pain control to accomplish this.        Medication List         ALEVE 220 MG tablet  Generic drug:  naproxen sodium  Take 440 mg by mouth daily as needed (pain).     bisacodyl 10 MG suppository  Commonly known as:  DULCOLAX  Place 1  suppository (10 mg total) rectally daily as needed for moderate constipation.     CALTRATE 600+D PO  Take 2 tablets by mouth at bedtime.     ciprofloxacin 500 MG tablet  Commonly known as:  CIPRO  Take 500 mg by mouth 2 (two) times daily. 3 day course started 02/24/14 pm     fexofenadine 180 MG tablet  Commonly known as:  ALLEGRA  Take 180 mg by mouth daily as needed for allergies.     furosemide 20 MG tablet  Commonly known as:  LASIX   Take 20 mg by mouth daily.     levothyroxine 50 MCG tablet  Commonly known as:  SYNTHROID, LEVOTHROID  Take 50 mcg by mouth daily before breakfast.     polyethylene glycol packet  Commonly known as:  MIRALAX / GLYCOLAX  Take 17 g by mouth daily as needed.     propranolol 10 MG tablet  Commonly known as:  INDERAL  Take 10 mg by mouth daily before lunch.     spironolactone 50 MG tablet  Commonly known as:  ALDACTONE  Take 50 mg by mouth daily.     traMADol 50 MG tablet  Commonly known as:  ULTRAM  Take 1-2 tablets (50-100 mg total) by mouth every 6 (six) hours as needed (50mg  for mild pain, 75mg  for moderate pain, 100mg  for severe pain).     valsartan 40 MG tablet  Commonly known as:  DIOVAN  Take 40 mg by mouth daily.     zolpidem 10 MG tablet  Commonly known as:  AMBIEN  Take 2.5-5 mg by mouth at bedtime as needed for sleep.         Follow-up Information   Follow up with HANDY,MICHAEL H, MD. Schedule an appointment as soon as possible for a visit in 2 weeks. (For post-hospital follow up regarding your pelvis fractures)    Specialty:  Orthopedic Surgery   Contact information:   [address] [place] [address] [phone]       Call [place]. (As needed)    Contact information:   [address] [place] [address] [phone]       Follow up with Iva Lento, PA-C. Schedule an appointment as soon as possible for a visit in 4 weeks. (For post-hospital follow up)    Specialty:  Internal Medicine   Contact information:   [place] [address] [phone]       Signed: Nehemiah Massed. [place], [facility]  Trauma Service [phone]  03/02/2014, 3:00 PM

## 2014-03-02 NOTE — Consult Note (Signed)
NAMECRISTIAN, Miranda Blackburn NO.:  [id number]  MEDICAL RECORD NO.:  [id number]  LOCATION:  5N19C                        FACILITY:  [facility]  PHYSICIAN:  Astrid Divine. Marcelino Scot, MD     DATE OF BIRTH:  08/22/1926  DATE OF CONSULTATION:  03/02/2014 DATE OF DISCHARGE:                                CONSULTATION   REQUESTING TRAUMA SERVICE:  Merri Ray. Grandville Silos, M.D.; [place], [facility].  REASON FOR CONSULTATION:  Ground level fall with left-sided pelvic fractures.  HISTORY OF PRESENT ILLNESS:  Ms. Huettner is an 80 year old white female who was out to dinner on February 26, 2014 when she sustained a ground level fall secondary to some wet floors, resulting in a fall on her left side.  The patient was brought to [place] at [facility] and was found to have multiple left-sided rib fractures as well as left superior and inferior pubic rami fractures.  The Trauma Service was consulted regarding her injury and she was transferred to [facility] for admission.  She pretty much complained primarily of left-sided rib pain.  She has been fairly mobile over the last several days and has been working diligently with physical therapy.  She was utilizing a walker to mobilize in the hallway.  She states that her pain is fairly well controlled.  Denies any numbness or tingling.  No additional issues noted.  She does report previous treatments for osteoporosis when she was down in [place], treated with pharmacologic agent, which she is unable to recall.  She is currently on vitamin D and calcium.  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  PAST SURGICAL HISTORY:  Notable for cholecystectomy, carpal  tunnel release, hemorrhoid surgery, breast lumpectomy, tubal ligation, tonsillectomy, bunionectomy, ablation for AV nodal reentry tachycardia.  FAMILY HISTORY:  Notable for CAD, stroke, and asthma.  SOCIAL HISTORY:  The patient does not smoke.  Does not drink.  She lives here in [place] with her daughters.  Previously from [place].  ALLERGIES:  Include PENICILLIN and SULFA.  MEDICATIONS:  Prior to admission reviewed and included in the chart.  Of note, she is on calcium and vitamin D, spironolactone, Synthroid, furosemide.  REVIEW OF SYSTEMS:  Primarily complains of left-sided chest pain and some left-sided pelvic and hip pain.  No back pain.  Denies any numbness or tingling.  No shortness of breath.  No nausea, vomiting, or abdominal pain.  No headaches.  No lightheadedness.  PHYSICAL EXAMINATION:  VITAL SIGNS:  Temperature 98.4, heart rate 61, respirations 13, BP 146/61, O2 sats 96% on room air. GENERAL:  The patient is a very pleasant 80 year old, white female who appears appropriate for stated age.  She is resting comfortably in bed. No acute distress. ABDOMEN:  Soft, nontender.  Positive bowel sounds. PELVIS:  No pain with lateral compression or AP compression of her pelvis. EXTREMITIES:  Bilateral lower extremities leg lengths are equal length. No gross deformities or malalignment appreciated.  No significant tenderness to palpation over lower  extremities bilaterally.  Hips, knees, and ankles are nontender bilaterally.  Soft tissues are notable for some ecchymotic lesions.  She does have some skin changes associated with peripheral vascular disease.  The patient demonstrates excellent range of motion of her hips, knees, and ankles bilaterally actively and passively.  DP and SP and TN and femoral nerve, sensory functions intact bilaterally.  EHL, FHL, anterior tibialis, posterior tibialis peroneals, gastrocsoleus complex, motor function are intact bilaterally.   The patient is able to perform a straight leg raise bilaterally as well as perform active knee flexion, extension, as well as hip flexion and extension.  Palpable dorsalis pedis pulses appreciated bilaterally. Extremities are warm.  The patient does not have any pain with axial loading or log rolling of her hips bilaterally as well.  IMAGING STUDIES:  CAT scan of her abdomen and pelvis is reviewed, which demonstrates fairly nondisplaced left-sided superior and inferior pubic rami fractures consistent with insufficiency type fractures.  No evidence of injury to her posterior pelvic ring.  LABORATORY DATA:  Labs were reviewed from September 28.  No further updated labs were obtained.  We did obtain a 25-hydroxy vitamin D, which is within normal range at 47 ng/mL.  ASSESSMENT AND PLAN:  An 80 year old female, status post ground level fall with left-sided rib fractures and left-sided insufficiency fractures of her superior and inferior pubic rami.  1. Ground level fall. 2. Insufficiency fractures left superior and inferior pubic rami.     These fractures will not require any surgical intervention.  She     can be weightbearing as tolerated on her left lower extremity with     the use of a walker for balance.  No range of motion restrictions     either.  I would anticipate full union in 8-12 weeks.  It appears     that she does not need any additional supplementation with vitamin     D as her levels are adequate, however, this fracture pattern is     associated with osteoporosis.  I would strongly recommend a     followup DEXA scan as an outpatient.  The patient does not recall     the last time she had the DEXA scan and believe when she was in     [place].  The patient will follow up with Orthopedics in 2-3 weeks,     at which time, we will obtain followup x-rays. 3. Left-sided rib pain.  Continue with incentive spirometry and pain     control and mobilize. 4. DVT and PE prophylaxis.   We would recommend 2 weeks of Lovenox at     discharge and consider transition to aspirin 325 b.i.d. after that. 5. Metabolic bone disease, osteoporosis.  Continue with calcium and     vitamin D supplementation.  Recommend outpatient DEXA scan to     quantitatively evaluate her bone density and this can be arranged     through her PCP or we can arrange at her follow up with [place]. 6. Impediments of fracture healing, her chronic medical diseases as     well as her chronic medications including her Synthroid and Lasix.  DISPOSITION:  The patient will continue to work with physical therapy. She will likely require short-term [employer] pay given minimal help at home.  She will follow up with Orthopedics in 2-3 weeks.  Please contact us with any questions or concerns.     Jari Pigg, PA-C     KWP/MEDQ  D:  03/02/2014  T:  03/02/2014  Job:  [id number]

## 2014-03-02 NOTE — Progress Notes (Signed)
Patient doing well.  Will go to a SNF when site chosen.  This patient has been seen and I agree with the findings and treatment plan.  Kathryne Eriksson. Dahlia Bailiff, MD, [place] [phone] (pager) [phone] (direct pager) Trauma Surgeon

## 2014-03-02 NOTE — Progress Notes (Signed)
[facility], nurse at [facility]. Patient ready for discharge.

## 2014-03-03 LAB — VITAMIN D 1,25 DIHYDROXY
VITAMIN D 1, 25 (OH) TOTAL: 37 pg/mL (ref 18–72)
VITAMIN D3 1, 25 (OH): 37 pg/mL
Vitamin D2 1, 25 (OH)2: <8 pg/mL

## 2014-03-06 NOTE — Consult Note (Signed)
Please see my note on full dictation report.  Altamese Bunnlevel, MD [facility] [phone] (p)

## 2014-03-06 NOTE — Consult Note (Signed)
I have reviewed and discussed in detail with Mr. Miranda Blackburn the patient's presentation, examination findings, and I formulated the plan outlined above.  Ecchymosis of lower extremities without associated point tenderness other than pelvis, and no pain with axial loading.  Should do well with PT.   Plan as outlined above.   Altamese Dewart, MD [facility] [phone] (p)

## 2014-03-10 ENCOUNTER — Telehealth (HOSPITAL_COMMUNITY): Payer: Self-pay

## 2014-03-10 NOTE — Telephone Encounter (Signed)
Spoke with Gabriel Cirri, does not recall calling us.  States pt does not need a follow up.  Can call if anything changes.  Shemika Robbs, ANP-BC

## 2014-06-02 ENCOUNTER — Ambulatory Visit: Payer: Medicare PPO | Attending: Orthopedic Surgery | Admitting: Physical Therapy

## 2014-06-02 DIAGNOSIS — I10 Essential (primary) hypertension: Secondary | ICD-10-CM | POA: Diagnosis not present

## 2014-06-02 DIAGNOSIS — M25551 Pain in right hip: Secondary | ICD-10-CM | POA: Diagnosis not present

## 2014-06-02 DIAGNOSIS — R262 Difficulty in walking, not elsewhere classified: Secondary | ICD-10-CM | POA: Insufficient documentation

## 2014-06-07 ENCOUNTER — Ambulatory Visit: Payer: Medicare PPO | Admitting: Physical Therapy

## 2014-06-10 ENCOUNTER — Ambulatory Visit: Payer: Medicare PPO | Admitting: Physical Therapy

## 2014-06-14 ENCOUNTER — Ambulatory Visit: Payer: Medicare PPO | Attending: Orthopedic Surgery | Admitting: Physical Therapy

## 2014-06-14 DIAGNOSIS — I10 Essential (primary) hypertension: Secondary | ICD-10-CM | POA: Insufficient documentation

## 2014-06-14 DIAGNOSIS — M25551 Pain in right hip: Secondary | ICD-10-CM | POA: Insufficient documentation

## 2014-06-14 DIAGNOSIS — R262 Difficulty in walking, not elsewhere classified: Secondary | ICD-10-CM | POA: Diagnosis not present

## 2014-06-16 ENCOUNTER — Ambulatory Visit: Payer: Medicare PPO | Admitting: Physical Therapy

## 2014-06-16 DIAGNOSIS — M25551 Pain in right hip: Secondary | ICD-10-CM | POA: Diagnosis not present

## 2014-06-23 ENCOUNTER — Ambulatory Visit: Payer: Medicare PPO | Admitting: Physical Therapy

## 2014-06-23 DIAGNOSIS — M25551 Pain in right hip: Secondary | ICD-10-CM | POA: Diagnosis not present

## 2014-06-28 ENCOUNTER — Ambulatory Visit: Payer: Medicare PPO | Admitting: Physical Therapy

## 2014-06-30 ENCOUNTER — Ambulatory Visit: Payer: Medicare PPO | Admitting: Physical Therapy

## 2015-02-02 DEATH — deceased

## 2015-03-04 DEATH — deceased

## 2015-12-07 IMAGING — CT CT HEAD W/O CM
5 of 6 series · 18 of 47 positions shown, 19 images · non-contrast
Comparison: None.

CLINICAL DATA: Fall, striking left side of body. Pain in the left
leg, hip, knee, flank, back, head, and face.

EXAM:
CT HEAD WITHOUT CONTRAST
CT CERVICAL SPINE WITHOUT CONTRAST
TECHNIQUE: Multidetector CT imaging of the head and cervical spine was
performed following the standard protocol without intravenous
contrast. Multiplanar CT image reconstructions of the cervical spine
were also generated.

[Series 2: head 4.8 h37s · axial · 0.45mm/px · z∈[+514,+563]mm · 2 of 32 slices shown, 3 images]
[im 11/32  brain]
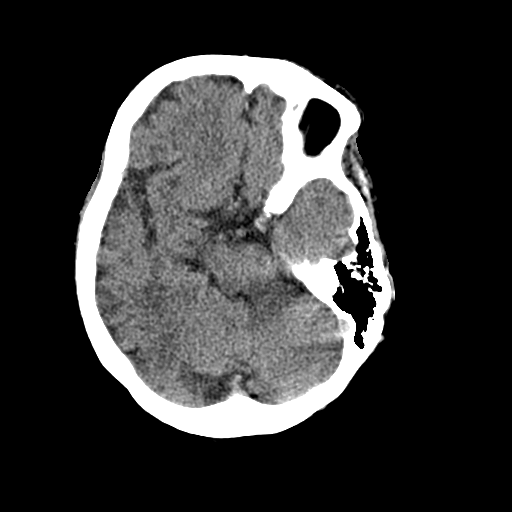
[im 11/32  bone]
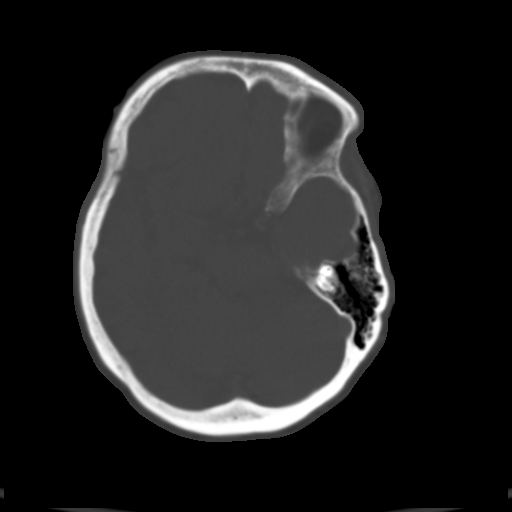
[im 21/32  brain]
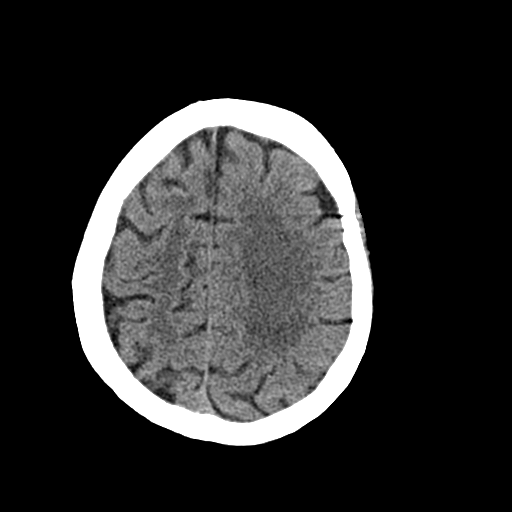

[Series 3: head 2.4 h60s bone · axial · 0.45mm/px · z∈[+489,+591]mm · 5 of 64 slices shown]
[im 11/64  bone]
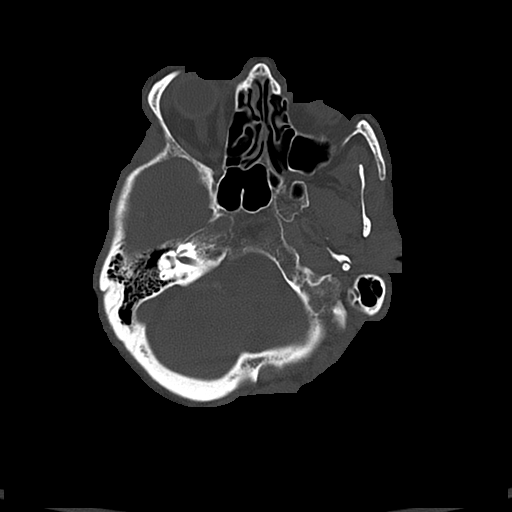
[im 22/64  bone]
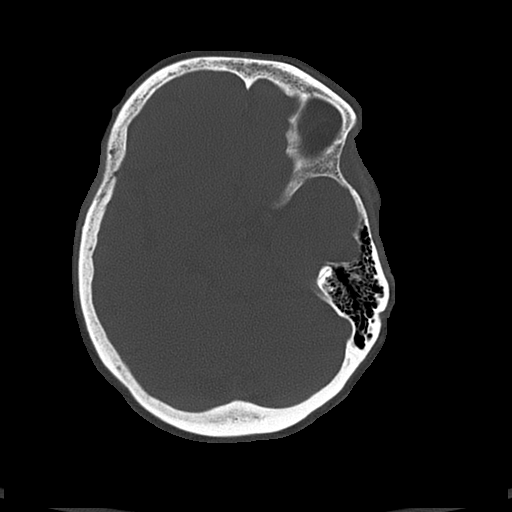
[im 32/64  bone]
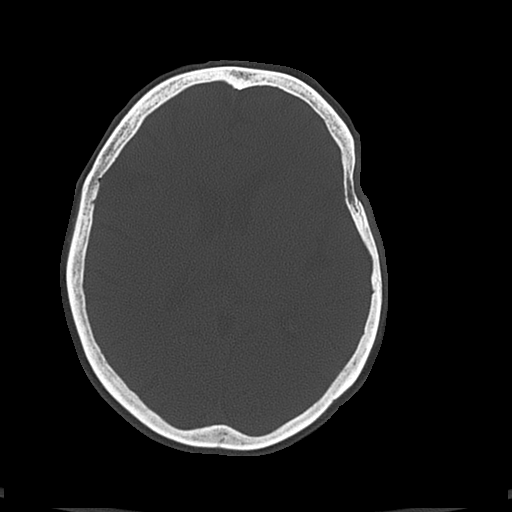
[im 43/64  bone]
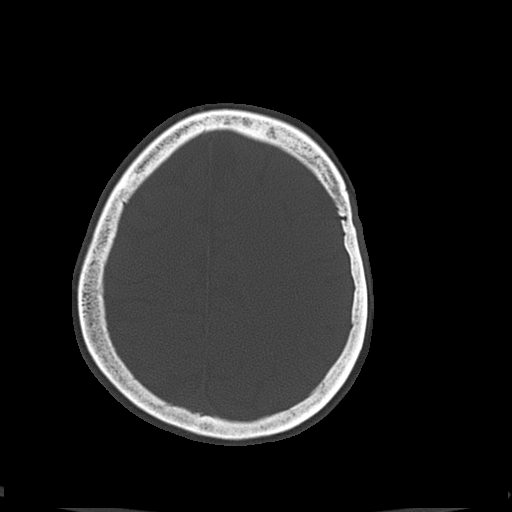
[im 53/64  bone]
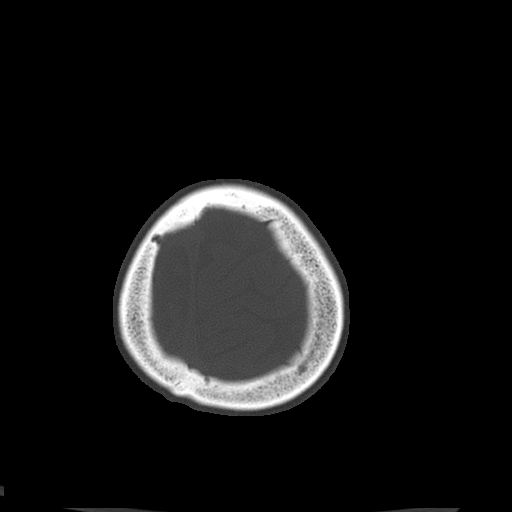

[Series 5: c_spine 2.0 b41s st · axial · 0.26mm/px · z∈[+325,+399]mm · 5 of 85 slices shown]
[im 10/85  brain]
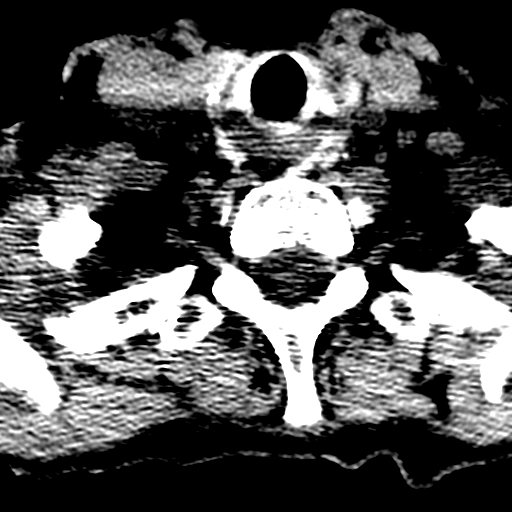
[im 19/85  brain]
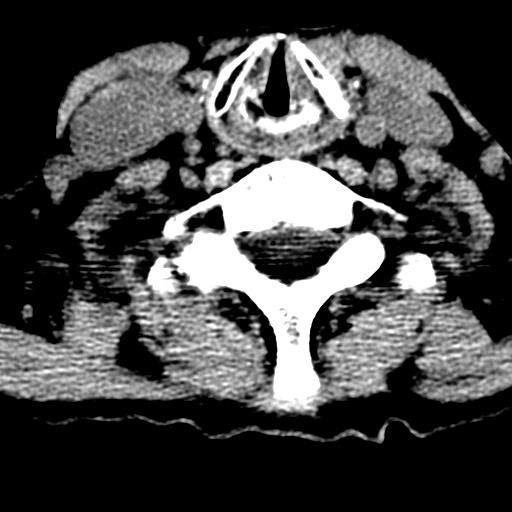
[im 29/85  brain]
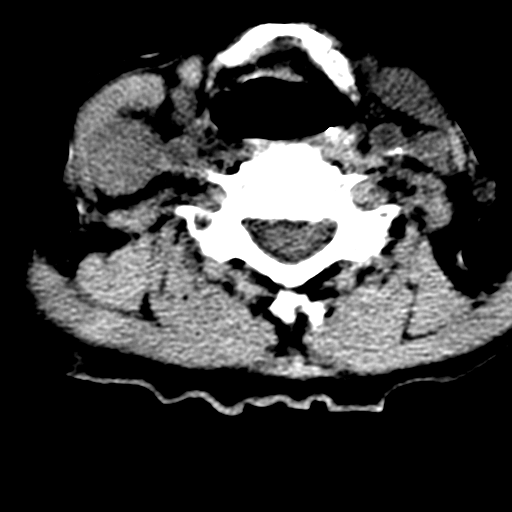
[im 38/85  brain]
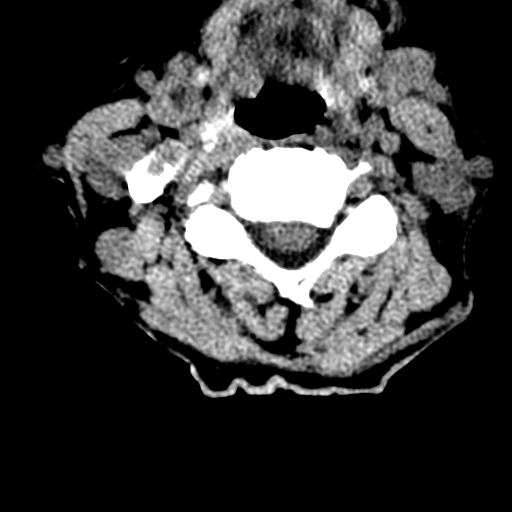
[im 47/85  brain]
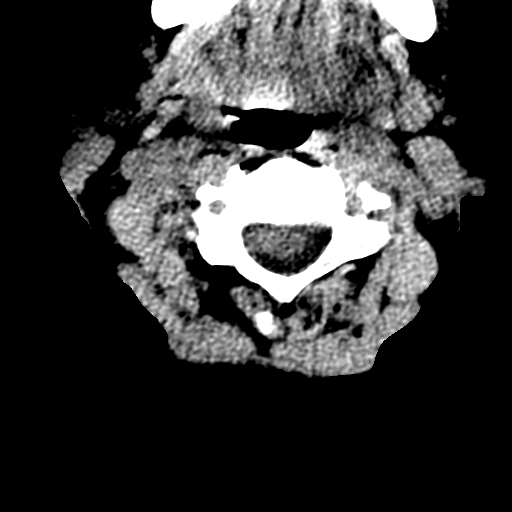

[Series 8: c_spine 2.0 coronal · coronal · 0.33mm/px · 3 of 61 slices shown]
[im 21/61  brain]
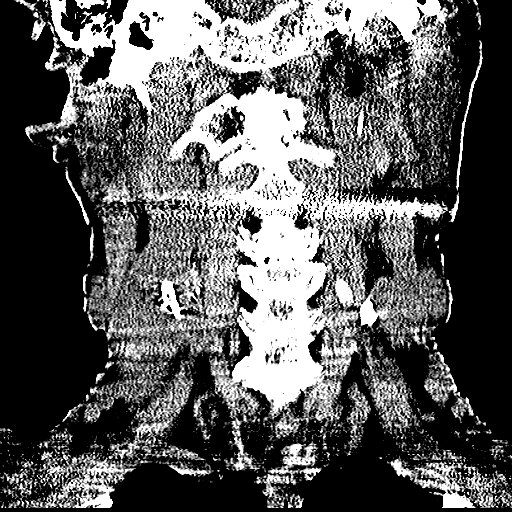
[im 27/61  brain]
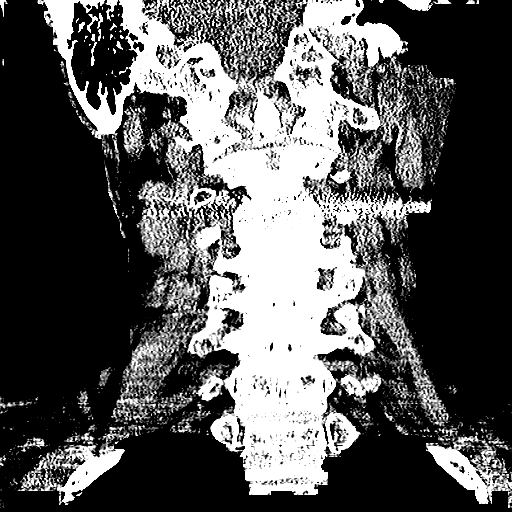
[im 34/61  brain]
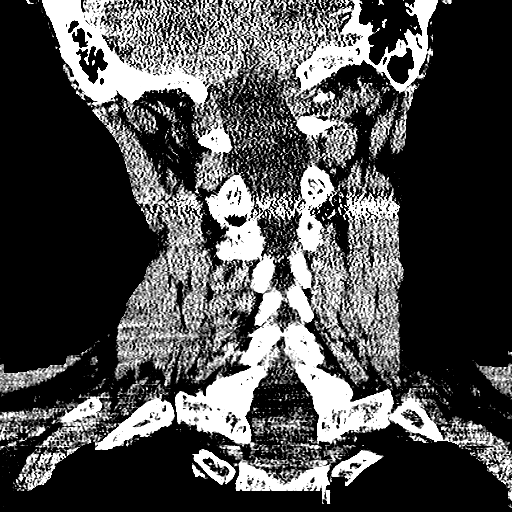

[Series 9: c_spine 2.0 sagittal · sagittal · 0.33mm/px · 3 of 43 slices shown]
[im 15/43  brain]
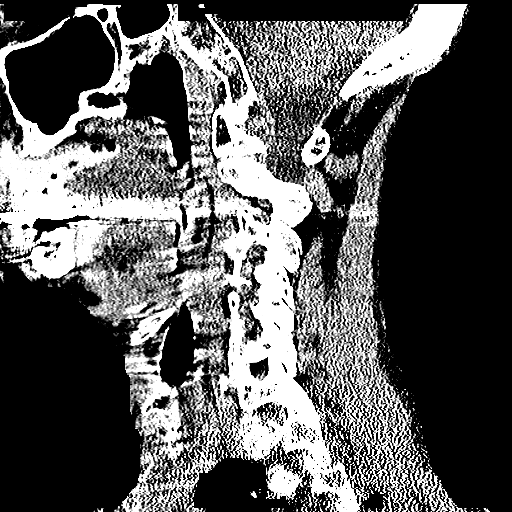
[im 22/43  brain]
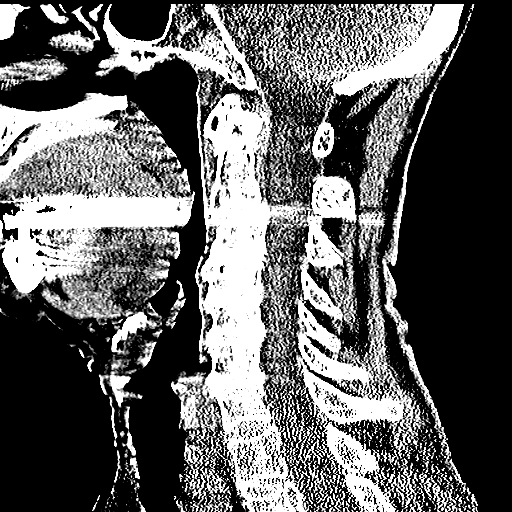
[im 29/43  brain]
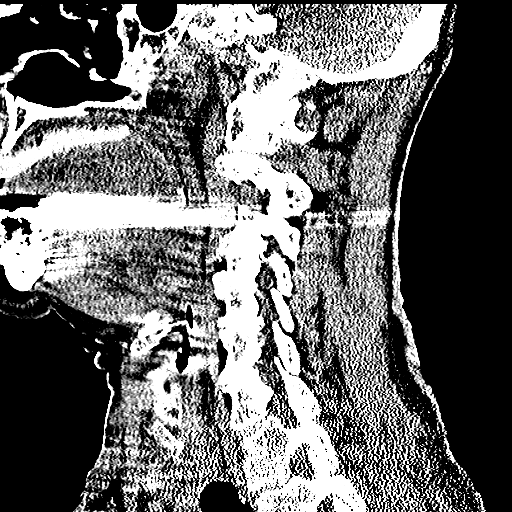

[18 of 47 positions shown; findings below may reference images not displayed]

FINDINGS: CT HEAD FINDINGS

Technically limited due to oblique patient positioning and motion
artifact. Mild diffuse cerebral atrophy. No ventricular dilatation.
Low-attenuation changes in the deep white matter consistent with
small vessel ischemia. No mass effect or midline shift. No abnormal
extra-axial fluid collections. Gray-white matter junctions are
distinct. Basal cisterns are not effaced. No evidence of acute
intracranial hemorrhage. No depressed skull fractures. Visualized
paranasal sinuses and mastoid air cells are not opacified.

CT CERVICAL SPINE FINDINGS

There is straightening of the usual cervical lordosis which may be
due to patient positioning but ligamentous injury or muscle spasm
could also have this appearance. No anterior subluxation. Diffuse
degenerative change throughout the cervical spine with narrowed
cervical interspaces and associated endplate hypertrophic changes.
Degenerative changes in the facet joints. No vertebral compression
deformities. No prevertebral soft tissue swelling. C1-2 articulation
appears intact. With no focal bone lesion or bone destruction. Bone
cortex and trabecular architecture appear intact. Vascular
calcifications in the cervical carotid arteries.
IMPRESSION: No acute intracranial abnormalities. Degenerative changes in the
cervical spine. Nonspecific straightening of the usual cervical
lordosis. No displaced fractures identified.
